# Patient Record
Sex: Female | Born: 1975 | Race: White | Hispanic: Yes | Marital: Married | Smoking: Never smoker
Health system: Southern US, Community
[De-identification: ages and names within clinical notes are randomized; demographics above are authoritative.]

## PROBLEM LIST (undated history)

## (undated) DIAGNOSIS — R51 Headache: Secondary | ICD-10-CM

## (undated) DIAGNOSIS — R112 Nausea with vomiting, unspecified: Secondary | ICD-10-CM

## (undated) DIAGNOSIS — Z9889 Other specified postprocedural states: Secondary | ICD-10-CM

## (undated) DIAGNOSIS — D649 Anemia, unspecified: Secondary | ICD-10-CM

## (undated) DIAGNOSIS — O021 Missed abortion: Secondary | ICD-10-CM

## (undated) HISTORY — PX: OTHER SURGICAL HISTORY: SHX169

## (undated) HISTORY — PX: CHOLECYSTECTOMY: SHX55

## (undated) HISTORY — DX: Missed abortion: O02.1

---

## 2006-02-22 ENCOUNTER — Inpatient Hospital Stay (HOSPITAL_COMMUNITY): Admission: AD | Admit: 2006-02-22 | Discharge: 2006-02-22 | Payer: Self-pay | Admitting: *Deleted

## 2006-02-24 ENCOUNTER — Inpatient Hospital Stay (HOSPITAL_COMMUNITY): Admission: AD | Admit: 2006-02-24 | Discharge: 2006-02-24 | Payer: Self-pay | Admitting: Obstetrics

## 2006-02-27 ENCOUNTER — Inpatient Hospital Stay (HOSPITAL_COMMUNITY): Admission: AD | Admit: 2006-02-27 | Discharge: 2006-02-27 | Payer: Self-pay | Admitting: Obstetrics

## 2012-10-21 ENCOUNTER — Encounter: Payer: Self-pay | Admitting: Gynecology

## 2012-10-21 ENCOUNTER — Ambulatory Visit (INDEPENDENT_AMBULATORY_CARE_PROVIDER_SITE_OTHER): Payer: Self-pay | Admitting: Gynecology

## 2012-10-21 VITALS — BP 112/70 | Ht 60.5 in | Wt 153.0 lb

## 2012-10-21 DIAGNOSIS — N938 Other specified abnormal uterine and vaginal bleeding: Secondary | ICD-10-CM

## 2012-10-21 DIAGNOSIS — H539 Unspecified visual disturbance: Secondary | ICD-10-CM

## 2012-10-21 DIAGNOSIS — N949 Unspecified condition associated with female genital organs and menstrual cycle: Secondary | ICD-10-CM

## 2012-10-21 DIAGNOSIS — R51 Headache: Secondary | ICD-10-CM

## 2012-10-21 DIAGNOSIS — R635 Abnormal weight gain: Secondary | ICD-10-CM

## 2012-10-21 MED ORDER — NORETHIN ACE-ETH ESTRAD-FE 1-20 MG-MCG PO TABS: 1.0000 | ORAL_TABLET | Freq: Every day | ORAL | Status: DC

## 2012-10-21 NOTE — Patient Instructions (Addendum)
Uso de los anticonceptivos orales  (Oral Contraception Use) Los anticonceptivos orales son medicamentos que se utilizan para evitar el embarazo. Su funcin es evitar que los ovarios liberen vulos. Las hormonas de los anticonceptivos orales hacen que el moco cervical se haga ms espeso, lo que evita que el esperma ingrese al tero. Tambin hacen que la membrana que tapiza el tero se vuelva ms fina, lo que no permite que el huevo fertilizado se adhiera a la pared del tero. Los anticonceptivos orales son muy efectivos cuando se toman exactamente como se prescriben. Sin embargo, los anticonceptivos orales no previenen contra las enfermedades de transmisin sexual (ETS). La prctica del sexo seguro, como el uso de preservativos, junto con los anticonceptivos orales, ayudan a prevenir ese tipo de enfermedades. Antes de tomar la pldora, usted debe hacerse un examen fsico y un [place]. El mdico podr indicarle anlisis de sangre, si es necesario. El mdico se asegurar de que usted es una buena candidata para usar anticonceptivos orales. Converse con su mdico acerca de los posibles efectos secundarios de los anticonceptivos orales. Cuando se inicia el uso de anticonceptivos orales, se pueden tomar durante 2 a 3 meses para que el cuerpo se adapte a los cambios en los niveles hormonales en el cuerpo.  CMO TOMAR LOS ANTICONCEPTIVOS ORALES  El mdico le indicar como comenzar a tomar el primer ciclo de anticonceptivos orales. De lo contrario usted puede:   Comenzar el 1er. da del ciclo menstrual. No necesitar proteccin anticonceptiva adicional al comenzar en este momento.  Comenzar el primer domingo luego de su perodo menstrual, o el da en que adquiere el medicamento. En estos casos deber tener proteccin anticonceptiva adicional durante los primeros 7 das del ciclo. Luego de comenzar a tomar los anticonceptivos orales:   Si olvid de tomar 1 pldora, tmela tan pronto como lo recuerde. Tome la  siguiente pldora a la hora habitual.  Si olvida tomar 2  ms pldoras, utilice un mtodo anticonceptivo adicional hasta que comience su prximo perodo menstrual.  Si utiliza el envase de 28 pldoras y olvida tomar 1 de las ltimas 7 (pldoras sin hormonas), sto no tiene importancia. Simplemente deseche el resto de las pldoras que no contienen hormonas y comience un nuevo envase. No importa cuando comience a tomar los anticonceptivos, siempre empiece un nuevo envase el mismo da de la semana. Tenga un envase extra de pldoras anticonceptivas y use un mtodo anticonceptivo adicional para el caso en que se olvide de tomar algunas pldoras o pierda la caja.  INSTRUCCIONES PARA EL CUIDADO DOMICILIARIO  No fume.  Siempre use un condn para protegerse de las enfermedades de transmisin sexual. Los anticonceptivos orales no protegen contra las enfermedades de transmisin sexual.  Marque en un calendario las fechas en las que tiene sus perodos menstruales.  Lea la informacin y consejos que vienen con las pldoras. Pngase en contacto con el mdico siempre que tenga preguntas. SOLICITE ATENCIN MDICA SI:  Presenta nuseas o vmitos.  Tiene flujo o sangrado vaginal anormal.  Aparece una erupcin cutnea.  No tiene el perodo menstrual.  Pierde el cabello.  Necesita tratamiento por cambios en su estado de nimo o por depresin.  Se siente mareada al tomar la pldora.  Comienza a aparecer acn con el uso de los anticonceptivos orales.  Queda embarazada. SOLICITE ATENCIN MDICA DE INMEDIATO SI:  Siente dolor en el pecho.  Le falta el aire.  Le duele mucho la cabeza y no puede controlar el dolor.  Siente adormecimiento o tiene   dificultad para hablar.  Tiene problemas de visin.  Presenta dolor, inflamacin o hinchazn en las piernas. Document Released: 11/08/2011 Document Revised: 02/11/2012 ExitCare Patient Information 2013 [employer].   

## 2012-10-21 NOTE — Progress Notes (Signed)
Patient is a 36 year old gravida 4 para 2 (spontaneous AB) who is new to the practice essentially. She has not been seen here since 2007. She states she's not using any form of contraception. The reason for visit is that she's had 2 menstrual cycles within 30 days. She had a home pregnancy test was negative. Urine pregnancy test was repeated here in the office which was negative as well. She stated she's had some fluctuations in her weight. She was recently involved in some job-related injury that affected her back and her neck that sometimes she gets headaches and occasional double vision.. She denies any gait or balance disturbance. She did deny any nipple discharge. She stated that she went to an urgent care with the symptoms several weeks ago and they put her on Provera with the following regimen: Provera 2.5 mg one every 6 hours for 4 days followed by one by mouth every 8 hour for 4 days followed by one by mouth every 12 hours for 4 days then 1 by mouth daily to complete 28 days. She stated her bleeding stopped yesterday just a slight brownish discharge. Patient stated several years ago in [place] she did use oral contraceptive pills. She denies any family history of any bleeding or clotting disorders. Patient is interested started back on oral contraceptive pill. She stated that she had a complete gynecological examination last year and that she's had normal Pap smears.  Exam: Bartholin urethra Skene was within normal limits Vagina: Brown dried blood was noted Cervix: No lesions or discharge Uterus: Anteverted normal size shape and consistency Adnexa: No palpable masses or tenderness Rectal exam: Not done  Assessment/plan: Dysfunctional uterine bleeding. Patient symptoms await fluctuation and headaches we'll go ahead and check not only a TSH will also check a prolactin level as well. She will be instructed to stop the Provera. She'll be started on Junel 1/20 oral contraceptive pill. She'll return back  to the office to see me in January for complete annual gynecological examination. Will notify her if there is any abnormality of any the above mentioned test. If she continues to have dysfunctional uterine bleeding after several months on the oral contraceptive pill I have explained her that she would need complete evaluation to include a sono infusion hysterogram with possible endometrial biopsy. All the above instructions were provided in Spanish and we'll follow accordingly. She will continue with her physical therapy as a result of her job-related injury.

## 2012-10-22 LAB — PROLACTIN: Prolactin: 8.9 ng/mL

## 2012-10-22 LAB — TSH: TSH: 0.987 u[IU]/mL (ref 0.350–4.500)

## 2012-12-18 ENCOUNTER — Encounter: Payer: Self-pay | Admitting: Gynecology

## 2012-12-18 ENCOUNTER — Ambulatory Visit (INDEPENDENT_AMBULATORY_CARE_PROVIDER_SITE_OTHER): Payer: BC Managed Care – HMO | Admitting: Gynecology

## 2012-12-18 VITALS — BP 120/80 | Ht 60.25 in | Wt 155.0 lb

## 2012-12-18 DIAGNOSIS — R6882 Decreased libido: Secondary | ICD-10-CM | POA: Insufficient documentation

## 2012-12-18 DIAGNOSIS — Z01419 Encounter for gynecological examination (general) (routine) without abnormal findings: Secondary | ICD-10-CM

## 2012-12-18 DIAGNOSIS — R635 Abnormal weight gain: Secondary | ICD-10-CM | POA: Insufficient documentation

## 2012-12-18 LAB — CBC WITH DIFFERENTIAL/PLATELET
Basophils Absolute: 0.1 10*3/uL (ref 0.0–0.1)
Basophils Relative: 1 % (ref 0–1)
Eosinophils Absolute: 0.3 10*3/uL (ref 0.0–0.7)
HCT: 36.6 % (ref 36.0–46.0)
Hemoglobin: 12.4 g/dL (ref 12.0–15.0)
Lymphs Abs: 3 10*3/uL (ref 0.7–4.0)
MCHC: 33.9 g/dL (ref 30.0–36.0)
MCV: 85.1 fL (ref 78.0–100.0)
Monocytes Absolute: 0.7 10*3/uL (ref 0.1–1.0)
RBC: 4.3 MIL/uL (ref 3.87–5.11)
WBC: 8.9 10*3/uL (ref 4.0–10.5)

## 2012-12-18 LAB — HEMOGLOBIN A1C: Hgb A1c MFr Bld: 5.7 % — ABNORMAL HIGH (ref <5.7)

## 2012-12-18 NOTE — Patient Instructions (Addendum)
Intrauterine Device Information  An intrauterine device (IUD) is inserted into your uterus and prevents pregnancy. There are 2 types of IUDs available:  · Copper IUD. This type of IUD is wrapped in copper wire and is placed inside the uterus. Copper makes the uterus and fallopian tubes produce a fluid that kills sperm. The copper IUD can stay in place for 10 years.  · Hormone IUD. This type of IUD contains the hormone progestin (synthetic progesterone). The hormone thickens the cervical mucus and prevents sperm from entering the uterus, and it also thins the uterine lining to prevent implantation of a fertilized egg. The hormone can weaken or kill the sperm that get into the uterus. The hormone IUD can stay in place for 5 years.  Your caregiver will make sure you are a good candidate for a contraceptive IUD. Discuss with your caregiver the possible side effects.  ADVANTAGES  · It is highly effective, reversible, long-acting, and low maintenance.  · There are no estrogen-related side effects.  · An IUD can be used when breastfeeding.  · It is not associated with weight gain.  · It works immediately after insertion.  · The copper IUD does not interfere with your female hormones.  · The progesterone IUD can make heavy menstrual periods lighter.  · The progesterone IUD can be used for 5 years.  · The copper IUD can be used for 10 years.  DISADVANTAGES  · The progesterone IUD can be associated with irregular bleeding patterns.  · The copper IUD can make your menstrual flow heavier and more painful.  · You may experience cramping and vaginal bleeding after insertion.  Document Released: 10/23/2004 Document Revised: 02/11/2012 Document Reviewed: 03/24/2011  ExitCare® Patient Information ©2013 [employer] LLC.

## 2012-12-18 NOTE — Progress Notes (Signed)
Madison Stewart 08/05/1976 [id number]   History:    37 y.o.  for annual gyn exam with no major complaints. She states at times she has decreased libido. She is currently on Junel 1/20 oral contraceptive pill. Patient's last Pap smear was in 2012 reportedly normal. Patient does her monthly self breast examination. Patient with strong family history of diabetes in her father.  Past medical history,surgical history, family history and social history were all reviewed and documented in the EPIC chart.  Gynecologic History Patient's last menstrual period was 12/12/2012. Contraception: OCP (estrogen/progesterone) Last Pap: 2012. Results were: normal Last mammogram: Not indicated. Results were: Not indicated  Obstetric History OB History    Grav Para Term Preterm Abortions TAB SAB Ect Mult Living   4 2 2  2  2   2      # Outc Date GA Lbr Len/2nd Wgt Sex Del Anes PTL Lv   1 TRM     M SVD  No Yes   2 SAB            3 SAB            4 TRM     F SVD  No Yes       ROS: A ROS was performed and pertinent positives and negatives are included in the history.  GENERAL: No fevers or chills. HEENT: No change in vision, no earache, sore throat or sinus congestion. NECK: No pain or stiffness. CARDIOVASCULAR: No chest pain or pressure. No palpitations. PULMONARY: No shortness of breath, cough or wheeze. GASTROINTESTINAL: No abdominal pain, nausea, vomiting or diarrhea, melena or bright red blood per rectum. GENITOURINARY: No urinary frequency, urgency, hesitancy or dysuria. MUSCULOSKELETAL: No joint or muscle pain, no back pain, no recent trauma. DERMATOLOGIC: No rash, no itching, no lesions. ENDOCRINE: No polyuria, polydipsia, no heat or cold intolerance. No recent change in weight. HEMATOLOGICAL: No anemia or easy bruising or bleeding. NEUROLOGIC: No headache, seizures, numbness, tingling or weakness. PSYCHIATRIC: No depression, no loss of interest in normal activity or change in sleep pattern.      Exam: chaperone present  BP 120/80  Ht 5' 0.25" (1.53 m)  Wt 155 lb (70.308 kg)  BMI 30.02 kg/m2  LMP 12/12/2012  Body mass index is 30.02 kg/(m^2).  General appearance : Well developed well nourished female. No acute distress HEENT: Neck supple, trachea midline, no carotid bruits, no thyroidmegaly Lungs: Clear to auscultation, no rhonchi or wheezes, or rib retractions  Heart: Regular rate and rhythm, no murmurs or gallops Breast:Examined in sitting and supine position were symmetrical in appearance, no palpable masses or tenderness,  no skin retraction, no nipple inversion, no nipple discharge, no skin discoloration, no axillary or supraclavicular lymphadenopathy Abdomen: no palpable masses or tenderness, no rebound or guarding Extremities: no edema or skin discoloration or tenderness  Pelvic:  Bartholin, Urethra, Skene Glands: Within normal limits             Vagina: No gross lesions or discharge  Cervix: No gross lesions or discharge  Uterus  anteverted, normal size, shape and consistency, non-tender and mobile  Adnexa  Without masses or tenderness  Anus and perineum  normal   Rectovaginal  normal sphincter tone without palpated masses or tenderness             Hemoccult not indicated     Assessment/Plan:  37 y.o. female for annual exam who had complained of decreased libido. We will check her TSH as well as total testosterone  level CBC cholesterol and urinalysis. No Pap smear done today the new guidelines were discussed. Patient was given literature information on the ParaGard T380A nonhormonal IUD. Patient will finish her current pack of the oral contraceptive pill and contact the office when her menses start so we can place the ParaGard T380A IUD. Literature information was provided in [place]. She was instructed to do her monthly self breast examination. The following labs were: Hemoglobin A1c, TSH, total cholesterol and urinalysis.    Ok Edwards MD, 10:42 PM  12/18/2012

## 2012-12-19 LAB — TESTOSTERONE: Testosterone: 32.74 ng/dL (ref 10–70)

## 2012-12-19 LAB — URINALYSIS W MICROSCOPIC + REFLEX CULTURE
Bacteria, UA: NONE SEEN
Crystals: NONE SEEN
Glucose, UA: NEGATIVE mg/dL
Protein, ur: NEGATIVE mg/dL
Urobilinogen, UA: 0.2 mg/dL (ref 0.0–1.0)

## 2012-12-29 ENCOUNTER — Telehealth: Payer: Self-pay | Admitting: Gynecology

## 2012-12-29 NOTE — Telephone Encounter (Signed)
Madison Stewart called this pt today with her [employer] benefits as she speaks Spanish only. Her [facility] insurance covers it at 100% under preventative services,no copay. She will call at start of next cycle to schedule an appt for insertion/WL

## 2013-01-03 HISTORY — PX: OTHER SURGICAL HISTORY: SHX169

## 2013-01-13 ENCOUNTER — Encounter: Payer: Self-pay | Admitting: Gynecology

## 2013-01-13 ENCOUNTER — Ambulatory Visit (INDEPENDENT_AMBULATORY_CARE_PROVIDER_SITE_OTHER): Payer: BC Managed Care – PPO | Admitting: Gynecology

## 2013-01-13 VITALS — BP 124/82

## 2013-01-13 DIAGNOSIS — Z975 Presence of (intrauterine) contraceptive device: Secondary | ICD-10-CM | POA: Insufficient documentation

## 2013-01-13 DIAGNOSIS — Z30430 Encounter for insertion of intrauterine contraceptive device: Secondary | ICD-10-CM

## 2013-01-13 NOTE — Patient Instructions (Addendum)
Informacin sobre el dispositivo intrauterino  (Intrauterine Device Information) El dispositivo intrauterino (DIU) se inserta en el tero e impide el embarazo. Hay dos tipos de DIU:   DIU de cobre. Este tipo de DIU est recubierto con un alambre de cobre y se inserta dentro del tero. El cobre hace que el tero y las trompas de [place] produzcan un liquido que [employer] espermatozoides. El DIU de cobre puede Geneticist, molecular durante 10 aos.  DIU hormonal. Este tipo de DIU contiene la hormona progestina (progesterona sinttica). La hormona espesa el moco cervical y evita que los espermatozoides ingresen al tero y tambin afina la membrana que cubre el tero para evitar la implantacin del vulo fertilizado. La hormona debilita o destruye los espermatozoides que ingresan al tero. El DIU hormonal puede Geneticist, molecular durante 5 aos. El mdico se asegurar de que usted es una buena candidata para usar el DIU cono anticonceptivo. Hable con su mdico acerca de los posibles efectos secundarios.  VENTAJAS  Es muy eficaz, reversible, de accin prolongada y de bajo mantenimiento.  No hay efectos secundarios relacionados con el estrgeno.  El DIU puede ser utilizado durante la Market researcher.  No est asociado con el aumento de [place].  Funciona inmediatamente despus de la insercin.  El DIU de cobre no interfiere con las hormonas femeninas.  El DIU con progesterona puede hacer que los perodos menstruales no sean tan abundantes.  El DIU de progesterona puede usarse durante 5 aos.  El DIU de cobre puede usarse durante 10 aos. DESVENTAJAS  El DIU de progesterona puede estar asociado con patrones de sangrado irregular.  El DIU de cobre puede hacer que el flujo menstrual ms abundante y doloroso.  Puede experimentar clicos y sangrado vaginal despus de la insercin. Document Released: 05/09/2010 Document Revised: 02/11/2012 [facility] Patient Information 2013 [place], [place].

## 2013-01-13 NOTE — Progress Notes (Signed)
Patient is a 37 year old who was seen in the office on January 16 for annual exam. She had been on the oral contraceptive pill and wanted to change to a nonhormonal form of contraception. She believes that this is also contributed to her decreased libido. Literature information had previously been provided on the ParaGard T380A IUD. The risks benefits and pros and cons had been discussed. She is fully aware that this form of contraception is 99% effective and is good for 10 years. Patient's currently menstruating.  Exam: Bartholin urethra Skene was within normal limits Vagina: Menstrual blood is present Cervix: Menstrual blood present no lesions seen Uterus retroverted normal size shape and consistency no palpable masses or tenderness  Adnexa: No palpable masses or tenderness Rectal exam not done  Procedure note: The cervix was cleansed with Betadine solution. A single-tooth tenaculum was placed on the anterior cervical lip. The uterus sounded to 8 cm. The ParaGard T380A IUD was shown to the patient and inserted in a sterile fashion and the string was trimmed. The single-tooth tenaculum was removed.  Assessment/plan: Patient status post placement of ParaGard T380A IUD without any complications. Patient will return back in one month for followup.

## 2013-02-10 ENCOUNTER — Ambulatory Visit (INDEPENDENT_AMBULATORY_CARE_PROVIDER_SITE_OTHER): Payer: BC Managed Care – PPO | Admitting: Gynecology

## 2013-02-10 ENCOUNTER — Encounter: Payer: Self-pay | Admitting: Gynecology

## 2013-02-10 VITALS — BP 102/60

## 2013-02-10 DIAGNOSIS — Z30431 Encounter for routine checking of intrauterine contraceptive device: Secondary | ICD-10-CM

## 2013-02-10 NOTE — Progress Notes (Signed)
Patient presented to the office today for her one month for followup after having placed a Mirena IUD. Patient is doing well. Very minimal spotting reported. She would like to have the IUD string trimmed because of her partner.  Exam: Bartholin urethra Skene was within normal limits Vagina: No lesions or discharge Cervix: IUD string seen in the string was trimmed Uterus: Anteverted normal size shape and consistency Adnexa: No palpable masses or tenderness Rectal exam: Not done  Assessment/plan: Patient one month status post placement of Mirena IUD doing well. Patient scheduled to return to the office next year for her annual exam or when necessary.

## 2013-07-03 NOTE — H&P (Signed)
History of Present Illness The patient is a 37 year old female who presents with neck pain. The patient is seen today in referral from Dr. Ethelene Hal (15 days post C6 SNRB on 06/09/13). The patient reports symptoms involving the right posterior neck and right lateral neck which began 9 month(s) (1/2 ) ago. The symptoms began following a specific injury (pulling an object from a machine with a pulling sensation about the right scapula region and radiating pain into the right posterior cervical region ). Symptoms include neck pain (posterior cervical radiating into the right scapula and right upper ext. to the level of the hand ), neck stiffness, numbness (right upper ext. to the level of the elbow ), weakness (right upper ext. ) and headaches. The patient describes the pain as sharp (and feel like cramps, new symptom of right index finger pain in addition to the thumb).The patient describes their symptoms as moderate in severity.The patient does feel that the symptoms are unchanged. Current treatment includes acetaminophen and muscle relaxants (Skelaxin but has ran out). Past evaluation has included cervical spine MRI and physical therapy evaluation. Past treatment has included physical therapy (with some relief of pain and stiffness ) and spinal injections ([id number] cervical ESI with minimal relief ). 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    Subjective Transcription  She returns today for a followup. She had an excellent response to the recent C-6 selective nerve root block that Dr. Ethelene Hal performed on 06-03-13. The patient states she felt excellent for several days and then it started to return. She states that overall her quality of life is severely diminished. The pain is significant. She would like this fixed.    Allergies No Known Allergies. 10/03/2012   Social History Marital status. married Living situation. live alone Illicit drug use. no Tobacco use. never  smoker Pain Contract. no Number of flights of stairs before winded. 2-3 Current work status. working full time Children. 2 Alcohol use. never consumed alcohol Exercise. Exercises weekly; does other Drug/Alcohol Rehab (Previously). no Drug/Alcohol Rehab (Currently). no   Medication History Acetaminophen (500MG  Tablet, 2 Oral daily) Active. Medications Reconciled.   Objective Transcription  The clinical exam is essentially unchanged. She still has numbness in the C6 distribution on the right. There is weakness of the biceps wrist extensor on the right, about a 4 out of 5. Negative Babinski. Negative clonus. Negative Hoffman's sign. No shortness of breath or chest pain. No other sensory deficits other than those on the right C6.    Assessment & Plan Cervical disc degeneration (722.4) Current Plans l Risks of surgery include, but are not limited to: Throat pain,swallowing difficulty, hoarseness or change in voice, Death, stroke, paralysis, nerve root damage/injury, bleeding, blood clots, loss of bowel/bladder control, hardware failure, or malposition, spinal fluid leak, adjacent segment disease, non-union, need for further surgery, ongoing or worse pain, infection and recurrent disc herniation  l Pt Education - How to access health information online: discussed with patient and provided information. l Restarted Skelaxin 800MG , 1 (one) Tablet every eight hours, as needed. May cause drowsiness., #90, 30 days starting 06/24/2013, No Refill.  Cervical radiculopathy (723.4)   Plans Transcription  At this point in time, we have gone over the risks of an ACDF.to include infection, bleeding, nerve damage, death , stroke, paralysis, failure to fuse, ongoing or worse pain, and need for further surgery.    All questions were encouraged and answered with the patient and her son. She has expressed  a desire to proceed with surgery. I will go ahead and get  appropriate clearance. I have allowed her to continue to work on restrictions. I just decreased her to four hours a day due to 8 hours causing exacerbation of her symptoms.

## 2013-07-08 ENCOUNTER — Encounter (HOSPITAL_COMMUNITY)
Admission: RE | Admit: 2013-07-08 | Discharge: 2013-07-08 | Disposition: A | Discharge status: Home / Self Care | Payer: Worker's Compensation | Source: Ambulatory Visit | Attending: Orthopedic Surgery | Admitting: Orthopedic Surgery

## 2013-07-08 ENCOUNTER — Encounter (HOSPITAL_COMMUNITY): Payer: Self-pay

## 2013-07-08 ENCOUNTER — Ambulatory Visit (HOSPITAL_COMMUNITY)
Admission: RE | Admit: 2013-07-08 | Discharge: 2013-07-08 | Disposition: A | Discharge status: Home / Self Care | Payer: BC Managed Care – PPO | Source: Ambulatory Visit | Attending: Orthopedic Surgery | Admitting: Orthopedic Surgery

## 2013-07-08 DIAGNOSIS — Z01818 Encounter for other preprocedural examination: Secondary | ICD-10-CM | POA: Insufficient documentation

## 2013-07-08 DIAGNOSIS — M5030 Other cervical disc degeneration, unspecified cervical region: Secondary | ICD-10-CM | POA: Insufficient documentation

## 2013-07-08 HISTORY — DX: Nausea with vomiting, unspecified: Z98.890

## 2013-07-08 HISTORY — DX: Nausea with vomiting, unspecified: R11.2

## 2013-07-08 HISTORY — DX: Headache: R51

## 2013-07-08 LAB — HCG, SERUM, QUALITATIVE: Preg, Serum: NEGATIVE

## 2013-07-08 LAB — CBC
Hemoglobin: 11.9 g/dL — ABNORMAL LOW (ref 12.0–15.0)
MCHC: 33.2 g/dL (ref 30.0–36.0)
RBC: 4.52 MIL/uL (ref 3.87–5.11)
RDW: 14.4 % (ref 11.5–15.5)

## 2013-07-08 LAB — SURGICAL PCR SCREEN: Staphylococcus aureus: NEGATIVE

## 2013-07-08 MED ORDER — DEXAMETHASONE SODIUM PHOSPHATE 4 MG/ML IJ SOLN
4.0000 mg | Freq: Once | INTRAMUSCULAR | Status: AC
  Administered 2013-07-09: 4 mg via INTRAVENOUS
  Filled 2013-07-08: qty 1

## 2013-07-08 MED ORDER — CEFAZOLIN SODIUM-DEXTROSE 2-3 GM-% IV SOLR
2.0000 g | INTRAVENOUS | Status: AC
  Administered 2013-07-09: 2 g via INTRAVENOUS
  Filled 2013-07-08: qty 50

## 2013-07-08 MED ORDER — ACETAMINOPHEN 10 MG/ML IV SOLN
1000.0000 mg | Freq: Four times a day (QID) | INTRAVENOUS | Status: DC
  Filled 2013-07-08: qty 100

## 2013-07-08 NOTE — Progress Notes (Signed)
Pt denies SOB, chest pain, and being under the care of a cardiologist. Pt denies having an echo, stress test, and cardiac catheterization.Pt made aware to bring brace DOS.

## 2013-07-08 NOTE — Pre-Procedure Instructions (Signed)
Madison Stewart  07/08/2013   Your procedure is scheduled on:  Thursday, July 09, 2013  Report to [facility] at 9:30 AM.  Call this number if you have problems the morning of surgery: [phone]   Remember:   Do not eat food or drink liquids after midnight.   Take these medicines the morning of surgery with A SIP OF WATER: none Stop taking Aspirin and herbal medications. Do not take any NSAIDs ie: Ibuprofen, Advil, Naproxen or any medication containing Aspirin.  Do not wear jewelry, make-up or nail polish.  Do not wear lotions, powders, or perfumes. You may wear deodorant.  Do not shave 48 hours prior to surgery  Do not bring valuables to the hospital.  [facility] is not responsible for any belongings or valuables.  Contacts, dentures or bridgework may not be worn into surgery.  Leave suitcase in the car. After surgery it may be brought to your room.  For patients admitted to the hospital, checkout time is 11:00 AM the day of discharge.   Patients discharged the day of surgery will not be allowed to drive home.  Name and phone number of your driver:   Special Instructions: Shower using CHG 2 nights before surgery and the night before surgery.  If you shower the day of surgery use CHG.  Use special wash - you have one bottle of CHG for all showers.  You should use approximately 1/3 of the bottle for each shower.   Please read over the following fact sheets that you were given: Pain Booklet, Coughing and Deep Breathing, MRSA Information and Surgical Site Infection Prevention

## 2013-07-09 ENCOUNTER — Observation Stay (HOSPITAL_COMMUNITY)
Admission: RE | Admit: 2013-07-09 | Discharge: 2013-07-10 | Disposition: A | Discharge status: Home / Self Care | Payer: Worker's Compensation | Source: Ambulatory Visit | Attending: Orthopedic Surgery | Admitting: Orthopedic Surgery

## 2013-07-09 ENCOUNTER — Encounter (HOSPITAL_COMMUNITY)
Admission: RE | Disposition: A | Discharge status: Home / Self Care | Payer: Self-pay | Source: Ambulatory Visit | Attending: Orthopedic Surgery

## 2013-07-09 ENCOUNTER — Ambulatory Visit (HOSPITAL_COMMUNITY): Payer: Worker's Compensation

## 2013-07-09 ENCOUNTER — Encounter (HOSPITAL_COMMUNITY): Payer: Self-pay | Admitting: *Deleted

## 2013-07-09 ENCOUNTER — Encounter (HOSPITAL_COMMUNITY): Payer: Self-pay | Admitting: Certified Registered Nurse Anesthetist

## 2013-07-09 ENCOUNTER — Ambulatory Visit (HOSPITAL_COMMUNITY): Payer: Worker's Compensation | Admitting: Certified Registered Nurse Anesthetist

## 2013-07-09 DIAGNOSIS — Z79899 Other long term (current) drug therapy: Secondary | ICD-10-CM | POA: Insufficient documentation

## 2013-07-09 DIAGNOSIS — M47812 Spondylosis without myelopathy or radiculopathy, cervical region: Principal | ICD-10-CM | POA: Insufficient documentation

## 2013-07-09 DIAGNOSIS — Z01812 Encounter for preprocedural laboratory examination: Secondary | ICD-10-CM | POA: Insufficient documentation

## 2013-07-09 HISTORY — PX: ANTERIOR CERVICAL DECOMP/DISCECTOMY FUSION: SHX1161

## 2013-07-09 HISTORY — PX: ANTERIOR FUSION CERVICAL SPINE: SUR626

## 2013-07-09 SURGERY — ANTERIOR CERVICAL DECOMPRESSION/DISCECTOMY FUSION 1 LEVEL
Anesthesia: General | Wound class: Clean

## 2013-07-09 MED ORDER — SODIUM CHLORIDE 0.9 % IJ SOLN: 3.0000 mL | INTRAMUSCULAR | Status: DC | PRN

## 2013-07-09 MED ORDER — MIDAZOLAM HCL 5 MG/5ML IJ SOLN
INTRAMUSCULAR | Status: DC | PRN
  Administered 2013-07-09: 2 mg via INTRAVENOUS

## 2013-07-09 MED ORDER — CEFAZOLIN SODIUM 1-5 GM-% IV SOLN
1.0000 g | Freq: Three times a day (TID) | INTRAVENOUS | Status: AC
  Administered 2013-07-09 (×2): 1 g via INTRAVENOUS
  Filled 2013-07-09 (×2): qty 50

## 2013-07-09 MED ORDER — THROMBIN 20000 UNITS EX SOLR
CUTANEOUS | Status: AC
  Filled 2013-07-09: qty 20000

## 2013-07-09 MED ORDER — DEXAMETHASONE 4 MG PO TABS
4.0000 mg | ORAL_TABLET | Freq: Four times a day (QID) | ORAL | Status: DC
  Filled 2013-07-09 (×7): qty 1

## 2013-07-09 MED ORDER — BUPIVACAINE-EPINEPHRINE PF 0.25-1:200000 % IJ SOLN
INTRAMUSCULAR | Status: AC
  Filled 2013-07-09: qty 30

## 2013-07-09 MED ORDER — LACTATED RINGERS IV SOLN
INTRAVENOUS | Status: DC | PRN
  Administered 2013-07-09 (×2): via INTRAVENOUS

## 2013-07-09 MED ORDER — METHOCARBAMOL 500 MG PO TABS: 500.0000 mg | ORAL_TABLET | Freq: Three times a day (TID) | ORAL | Status: DC | PRN

## 2013-07-09 MED ORDER — NEOSTIGMINE METHYLSULFATE 1 MG/ML IJ SOLN
INTRAMUSCULAR | Status: DC | PRN
  Administered 2013-07-09: 3 mg via INTRAVENOUS

## 2013-07-09 MED ORDER — PHENOL 1.4 % MT LIQD: 1.0000 | OROMUCOSAL | Status: DC | PRN

## 2013-07-09 MED ORDER — ROCURONIUM BROMIDE 100 MG/10ML IV SOLN
INTRAVENOUS | Status: DC | PRN
  Administered 2013-07-09: 50 mg via INTRAVENOUS

## 2013-07-09 MED ORDER — HYDROMORPHONE HCL PF 1 MG/ML IJ SOLN
0.2500 mg | INTRAMUSCULAR | Status: DC | PRN
  Administered 2013-07-09 (×2): 0.5 mg via INTRAVENOUS

## 2013-07-09 MED ORDER — GLYCOPYRROLATE 0.2 MG/ML IJ SOLN
INTRAMUSCULAR | Status: DC | PRN
  Administered 2013-07-09: 0.4 mg via INTRAVENOUS

## 2013-07-09 MED ORDER — BUPIVACAINE-EPINEPHRINE 0.25% -1:200000 IJ SOLN
INTRAMUSCULAR | Status: DC | PRN
  Administered 2013-07-09: 4 mL

## 2013-07-09 MED ORDER — HYDROMORPHONE HCL PF 1 MG/ML IJ SOLN
INTRAMUSCULAR | Status: DC | PRN
  Administered 2013-07-09: 1 mg via INTRAVENOUS

## 2013-07-09 MED ORDER — OXYCODONE-ACETAMINOPHEN 10-325 MG PO TABS: 1.0000 | ORAL_TABLET | ORAL | Status: DC | PRN

## 2013-07-09 MED ORDER — ACETAMINOPHEN 10 MG/ML IV SOLN
1000.0000 mg | Freq: Four times a day (QID) | INTRAVENOUS | Status: DC
  Administered 2013-07-09 – 2013-07-10 (×3): 1000 mg via INTRAVENOUS
  Filled 2013-07-09 (×4): qty 100

## 2013-07-09 MED ORDER — OXYCODONE HCL 5 MG PO TABS
10.0000 mg | ORAL_TABLET | ORAL | Status: DC | PRN
  Administered 2013-07-10 (×2): 10 mg via ORAL
  Filled 2013-07-09 (×2): qty 2

## 2013-07-09 MED ORDER — HYDROMORPHONE HCL PF 1 MG/ML IJ SOLN
INTRAMUSCULAR | Status: AC
  Filled 2013-07-09: qty 1

## 2013-07-09 MED ORDER — THROMBIN 20000 UNITS EX SOLR
CUTANEOUS | Status: DC | PRN
  Administered 2013-07-09: 13:00:00 via TOPICAL

## 2013-07-09 MED ORDER — LIDOCAINE HCL 4 % MT SOLN
OROMUCOSAL | Status: DC | PRN
  Administered 2013-07-09: 4 mL via TOPICAL

## 2013-07-09 MED ORDER — ARTIFICIAL TEARS OP OINT
TOPICAL_OINTMENT | OPHTHALMIC | Status: DC | PRN
  Administered 2013-07-09: 1 via OPHTHALMIC

## 2013-07-09 MED ORDER — FENTANYL CITRATE 0.05 MG/ML IJ SOLN
INTRAMUSCULAR | Status: DC | PRN
  Administered 2013-07-09 (×2): 100 ug via INTRAVENOUS
  Administered 2013-07-09: 50 ug via INTRAVENOUS

## 2013-07-09 MED ORDER — METHOCARBAMOL 100 MG/ML IJ SOLN: 500.0000 mg | Freq: Four times a day (QID) | INTRAVENOUS | Status: DC | PRN

## 2013-07-09 MED ORDER — SODIUM CHLORIDE 0.9 % IV SOLN
250.0000 mL | INTRAVENOUS | Status: DC
  Administered 2013-07-09: 250 mL via INTRAVENOUS

## 2013-07-09 MED ORDER — ONDANSETRON HCL 4 MG/2ML IJ SOLN
4.0000 mg | INTRAMUSCULAR | Status: DC | PRN
  Administered 2013-07-09: 4 mg via INTRAVENOUS
  Filled 2013-07-09: qty 2

## 2013-07-09 MED ORDER — ZOLPIDEM TARTRATE 5 MG PO TABS: 5.0000 mg | ORAL_TABLET | Freq: Every evening | ORAL | Status: DC | PRN

## 2013-07-09 MED ORDER — MORPHINE SULFATE 2 MG/ML IJ SOLN
1.0000 mg | INTRAMUSCULAR | Status: DC | PRN
  Administered 2013-07-09: 2 mg via INTRAVENOUS
  Filled 2013-07-09: qty 1

## 2013-07-09 MED ORDER — DEXAMETHASONE SODIUM PHOSPHATE 4 MG/ML IJ SOLN
4.0000 mg | Freq: Four times a day (QID) | INTRAMUSCULAR | Status: DC
  Administered 2013-07-09 – 2013-07-10 (×3): 4 mg via INTRAVENOUS
  Filled 2013-07-09 (×7): qty 1

## 2013-07-09 MED ORDER — LIDOCAINE HCL (CARDIAC) 20 MG/ML IV SOLN
INTRAVENOUS | Status: DC | PRN
  Administered 2013-07-09: 60 mg via INTRAVENOUS

## 2013-07-09 MED ORDER — ACETAMINOPHEN 10 MG/ML IV SOLN
INTRAVENOUS | Status: DC | PRN
  Administered 2013-07-09: 1000 mg via INTRAVENOUS

## 2013-07-09 MED ORDER — SODIUM CHLORIDE 0.9 % IJ SOLN: 3.0000 mL | Freq: Two times a day (BID) | INTRAMUSCULAR | Status: DC

## 2013-07-09 MED ORDER — MENTHOL 3 MG MT LOZG: 1.0000 | LOZENGE | OROMUCOSAL | Status: DC | PRN

## 2013-07-09 MED ORDER — ONDANSETRON HCL 4 MG PO TABS: 4.0000 mg | ORAL_TABLET | Freq: Three times a day (TID) | ORAL | Status: DC | PRN

## 2013-07-09 MED ORDER — PROPOFOL 10 MG/ML IV BOLUS
INTRAVENOUS | Status: DC | PRN
  Administered 2013-07-09: 200 mg via INTRAVENOUS

## 2013-07-09 MED ORDER — DOCUSATE SODIUM 100 MG PO CAPS: 100.0000 mg | ORAL_CAPSULE | Freq: Three times a day (TID) | ORAL | Status: DC | PRN

## 2013-07-09 MED ORDER — 0.9 % SODIUM CHLORIDE (POUR BTL) OPTIME
TOPICAL | Status: DC | PRN
  Administered 2013-07-09: 400 mL

## 2013-07-09 MED ORDER — METHOCARBAMOL 500 MG PO TABS
500.0000 mg | ORAL_TABLET | Freq: Four times a day (QID) | ORAL | Status: DC | PRN
  Administered 2013-07-10: 500 mg via ORAL
  Filled 2013-07-09: qty 1

## 2013-07-09 MED ORDER — POLYETHYLENE GLYCOL 3350 17 GM/SCOOP PO POWD: 17.0000 g | Freq: Every day | ORAL | Status: DC

## 2013-07-09 MED ORDER — LACTATED RINGERS IV SOLN
INTRAVENOUS | Status: DC
  Administered 2013-07-09: 85 mL/h via INTRAVENOUS

## 2013-07-09 MED ORDER — LACTATED RINGERS IV SOLN
INTRAVENOUS | Status: DC
  Administered 2013-07-09: 11:00:00 via INTRAVENOUS

## 2013-07-09 MED ORDER — ONDANSETRON HCL 4 MG/2ML IJ SOLN
INTRAMUSCULAR | Status: DC | PRN
  Administered 2013-07-09: 4 mg via INTRAVENOUS

## 2013-07-09 SURGICAL SUPPLY — 59 items
BLADE SURG ROTATE 9660 (MISCELLANEOUS) IMPLANT
BUR EGG ELITE 4.0 (BURR) IMPLANT
BUR MATCHSTICK NEURO 3.0 LAGG (BURR) IMPLANT
CANISTER SUCTION 2500CC (MISCELLANEOUS) ×2 IMPLANT
CLOTH BEACON ORANGE TIMEOUT ST (SAFETY) ×2 IMPLANT
CLSR STERI-STRIP ANTIMIC 1/2X4 (GAUZE/BANDAGES/DRESSINGS) ×2 IMPLANT
CORDS BIPOLAR (ELECTRODE) ×2 IMPLANT
COVER SURGICAL LIGHT HANDLE (MISCELLANEOUS) ×4 IMPLANT
CRADLE DONUT ADULT HEAD (MISCELLANEOUS) ×2 IMPLANT
DRAPE C-ARM 42X72 X-RAY (DRAPES) ×2 IMPLANT
DRAPE POUCH INSTRU U-SHP 10X18 (DRAPES) ×2 IMPLANT
DRAPE SURG 17X23 STRL (DRAPES) ×2 IMPLANT
DRAPE U-SHAPE 47X51 STRL (DRAPES) ×2 IMPLANT
DRSG MEPILEX BORDER 4X4 (GAUZE/BANDAGES/DRESSINGS) ×2 IMPLANT
DURAPREP 26ML APPLICATOR (WOUND CARE) ×2 IMPLANT
ELECT COATED BLADE 2.86 ST (ELECTRODE) ×2 IMPLANT
ELECT REM PT RETURN 9FT ADLT (ELECTROSURGICAL) ×2
ELECTRODE REM PT RTRN 9FT ADLT (ELECTROSURGICAL) ×1 IMPLANT
GLOVE BIOGEL PI IND STRL 7.0 (GLOVE) ×2 IMPLANT
GLOVE BIOGEL PI IND STRL 8.5 (GLOVE) ×1 IMPLANT
GLOVE BIOGEL PI INDICATOR 7.0 (GLOVE) ×2
GLOVE BIOGEL PI INDICATOR 8.5 (GLOVE) ×1
GLOVE ECLIPSE 8.5 STRL (GLOVE) ×2 IMPLANT
GLOVE SURG SS PI 6.5 STRL IVOR (GLOVE) ×2 IMPLANT
GLOVE SURG SS PI 7.0 STRL IVOR (GLOVE) ×1 IMPLANT
GOWN PREVENTION PLUS XXLARGE (GOWN DISPOSABLE) ×2 IMPLANT
GOWN STRL REIN XL XLG (GOWN DISPOSABLE) ×4 IMPLANT
INTERLOCK LRDTC CRVCL VBR 7MM (Bone Implant) IMPLANT
KIT BASIN OR (CUSTOM PROCEDURE TRAY) ×2 IMPLANT
KIT ROOM TURNOVER OR (KITS) ×2 IMPLANT
LORDOTIC CERVICAL VBR 7MM SM (Bone Implant) ×2 IMPLANT
NDL SPNL 18GX3.5 QUINCKE PK (NEEDLE) ×1 IMPLANT
NEEDLE SPNL 18GX3.5 QUINCKE PK (NEEDLE) ×2 IMPLANT
NS IRRIG 1000ML POUR BTL (IV SOLUTION) ×2 IMPLANT
PACK ORTHO CERVICAL (CUSTOM PROCEDURE TRAY) ×2 IMPLANT
PACK UNIVERSAL I (CUSTOM PROCEDURE TRAY) ×2 IMPLANT
PAD ARMBOARD 7.5X6 YLW CONV (MISCELLANEOUS) ×4 IMPLANT
PATTIES SURGICAL .25X.25 (GAUZE/BANDAGES/DRESSINGS) IMPLANT
PIN DISTRACTION 14 (PIN) ×2 IMPLANT
PLATE LEVEL 1 TI VECTRA 14MM (Plate) ×1 IMPLANT
PUTTY BONE DBX 2.5 MIS (Bone Implant) ×1 IMPLANT
RESTRAINT LIMB HOLDER UNIV (RESTRAINTS) ×2 IMPLANT
SCREW 4.0X14MM (Screw) ×8 IMPLANT
SCREW BN 14X4XSLF DRL VA SLF (Screw) IMPLANT
SPONGE INTESTINAL PEANUT (DISPOSABLE) ×2 IMPLANT
SPONGE SURGIFOAM ABS GEL 100 (HEMOSTASIS) ×2 IMPLANT
STRIP CLOSURE SKIN 1/2X4 (GAUZE/BANDAGES/DRESSINGS) ×1 IMPLANT
SURGIFLO TRUKIT (HEMOSTASIS) IMPLANT
SUT MNCRL AB 3-0 PS2 18 (SUTURE) ×2 IMPLANT
SUT SILK 2 0 (SUTURE)
SUT SILK 2-0 18XBRD TIE 12 (SUTURE) IMPLANT
SUT VIC AB 2-0 CT1 18 (SUTURE) ×2 IMPLANT
SYR BULB IRRIGATION 50ML (SYRINGE) ×2 IMPLANT
SYR CONTROL 10ML LL (SYRINGE) ×2 IMPLANT
TAPE CLOTH 4X10 WHT NS (GAUZE/BANDAGES/DRESSINGS) ×2 IMPLANT
TAPE UMBILICAL COTTON 1/8X30 (MISCELLANEOUS) ×2 IMPLANT
TOWEL OR 17X24 6PK STRL BLUE (TOWEL DISPOSABLE) ×2 IMPLANT
TOWEL OR 17X26 10 PK STRL BLUE (TOWEL DISPOSABLE) ×2 IMPLANT
WATER STERILE IRR 1000ML POUR (IV SOLUTION) ×2 IMPLANT

## 2013-07-09 NOTE — Transfer of Care (Signed)
Immediate Anesthesia Transfer of Care Note  Patient: Madison Stewart  Procedure(s) Performed: Procedure(s): ANTERIOR CERVICAL DECOMPRESSION/DISCECTOMY FUSION C5-6 (N/A)  Patient Location: PACU  Anesthesia Type:General  Level of Consciousness: awake, alert  and oriented  Airway & Oxygen Therapy: Patient Spontanous Breathing and Patient connected to nasal cannula oxygen  Post-op Assessment: Report given to PACU RN, Post -op Vital signs reviewed and stable and Patient moving all extremities  Post vital signs: Reviewed and stable  Complications: No apparent anesthesia complications

## 2013-07-09 NOTE — Anesthesia Preprocedure Evaluation (Addendum)
Anesthesia Evaluation  Patient identified by MRN, date of birth, ID band Patient awake    Reviewed: Allergy & Precautions, H&P , NPO status , Patient's Chart, lab work & pertinent test results  History of Anesthesia Complications (+) PONV  Airway Mallampati: I TM Distance: >3 FB Neck ROM: Full    Dental  (+) Teeth Intact and Dental Advisory Given   Pulmonary  breath sounds clear to auscultation        Cardiovascular negative cardio ROS  Rhythm:Regular Rate:Normal     Neuro/Psych  Headaches, Anxiety    GI/Hepatic negative GI ROS, Neg liver ROS,   Endo/Other    Renal/GU negative Renal ROS     Musculoskeletal   Abdominal   Peds  Hematology negative hematology ROS (+)   Anesthesia Other Findings   Reproductive/Obstetrics                          Anesthesia Physical Anesthesia Plan  ASA: II  Anesthesia Plan: General   Post-op Pain Management:    Induction: Intravenous  Airway Management Planned: Oral ETT  Additional Equipment:   Intra-op Plan:   Post-operative Plan: Extubation in OR  Informed Consent: I have reviewed the patients History and Physical, chart, labs and discussed the procedure including the risks, benefits and alternatives for the proposed anesthesia with the patient or authorized representative who has indicated his/her understanding and acceptance.   Dental advisory given  Plan Discussed with: Anesthesiologist  Anesthesia Plan Comments:         Anesthesia Quick Evaluation

## 2013-07-09 NOTE — Anesthesia Procedure Notes (Signed)
Procedure Name: Intubation Date/Time: 07/09/2013 12:19 PM Performed by: Orvilla Fus A Pre-anesthesia Checklist: Timeout performed, Patient identified, Emergency Drugs available, Suction available and Patient being monitored Patient Re-evaluated:Patient Re-evaluated prior to inductionOxygen Delivery Method: Circle system utilized Preoxygenation: Pre-oxygenation with 100% oxygen Intubation Type: IV induction Ventilation: Mask ventilation without difficulty Laryngoscope Size: Mac and 3 Grade View: Grade I Tube type: Oral Tube size: 7.0 mm Number of attempts: 1 Airway Equipment and Method: Stylet and LTA kit utilized Placement Confirmation: ETT inserted through vocal cords under direct vision,  breath sounds checked- equal and bilateral and positive ETCO2 Secured at: 20 cm Tube secured with: Tape Dental Injury: Teeth and Oropharynx as per pre-operative assessment

## 2013-07-09 NOTE — H&P (Signed)
No change in clinical exam H+P reviewed  

## 2013-07-09 NOTE — Anesthesia Postprocedure Evaluation (Signed)
  Anesthesia Post-op Note  Patient: Madison Stewart  Procedure(s) Performed: Procedure(s): ANTERIOR CERVICAL DECOMPRESSION/DISCECTOMY FUSION C5-6 (N/A)  Patient Location: PACU  Anesthesia Type:General  Level of Consciousness: awake, oriented, sedated and patient cooperative  Airway and Oxygen Therapy: Patient Spontanous Breathing  Post-op Pain: mild  Post-op Assessment: Post-op Vital signs reviewed, Patient's Cardiovascular Status Stable, Respiratory Function Stable, Patent Airway, No signs of Nausea or vomiting and Pain level controlled  Post-op Vital Signs: stable  Complications: No apparent anesthesia complications

## 2013-07-09 NOTE — Brief Op Note (Signed)
07/09/2013  1:57 PM  PATIENT:  Madison Stewart  37 y.o. female  PRE-OPERATIVE DIAGNOSIS:  CERVICAL SPONDYLOSIS  POST-OPERATIVE DIAGNOSIS:  CERVICAL SPONDYLOSIS  PROCEDURE:  Procedure(s): ANTERIOR CERVICAL DECOMPRESSION/DISCECTOMY FUSION C5-6 (N/A)  SURGEON:  Surgeon(s) and Role:    * Venita Lick, MD - Primary  PHYSICIAN ASSISTANT:   ASSISTANTS: none   ANESTHESIA:   general  EBL:  Total I/O In: 1800 [I.V.:1800] Out: 25 [Blood:25]  BLOOD ADMINISTERED:none  DRAINS: none   LOCAL MEDICATIONS USED:  MARCAINE     SPECIMEN:  No Specimen  DISPOSITION OF SPECIMEN:  N/A  COUNTS:  YES  TOURNIQUET:  * No tourniquets in log *  DICTATION: .Other Dictation: Dictation Number [id number]  PLAN OF CARE: Admit for overnight observation  PATIENT DISPOSITION:  PACU - hemodynamically stable.

## 2013-07-10 ENCOUNTER — Observation Stay (HOSPITAL_COMMUNITY): Payer: Worker's Compensation

## 2013-07-10 NOTE — Evaluation (Signed)
Occupational Therapy Evaluation Patient Details Name: Madison Stewart MRN: [id number] DOB: August 09, 1976 Today's Date: 07/10/2013 Time: 8295-6213 OT Time Calculation (min): 18 min  OT Assessment / Plan / Recommendation History of present illness 37 y.o. female admitted for ANTERIOR CERVICAL DECOMPRESSION/DISCECTOMY FUSION C5-6   Clinical Impression   Patient evaluated by Occupational Therapy with no further acute OT needs identified. All education has been completed and the patient has no further questions. Pt demonstrates good awareness of precautions. See below for any follow-up Occupational Therapy or equipment needs. OT is signing off. Thank you for this referral.     OT Assessment  Patient does not need any further OT services    Follow Up Recommendations  No OT follow up    Barriers to Discharge      Equipment Recommendations  None recommended by OT    Recommendations for Other Services    Frequency       Precautions / Restrictions Precautions Precautions: Cervical Required Braces or Orthoses: Cervical Brace Cervical Brace: Hard collar   Pertinent Vitals/Pain     ADL  Eating/Feeding: Modified independent Where Assessed - Eating/Feeding: Bed level Grooming: Wash/dry hands;Wash/dry face;Teeth care;Brushing hair;Supervision/safety Where Assessed - Grooming: Unsupported standing Upper Body Bathing: Supervision/safety Where Assessed - Upper Body Bathing: Unsupported sitting Lower Body Bathing: Supervision/safety Where Assessed - Lower Body Bathing: Unsupported sit to stand Upper Body Dressing: Supervision/safety Where Assessed - Upper Body Dressing: Unsupported sitting Lower Body Dressing: Supervision/safety Where Assessed - Lower Body Dressing: Unsupported sit to stand Toilet Transfer: Supervision/safety Toilet Transfer Method: Sit to stand;Stand pivot Acupuncturist: Regular height toilet Toileting - Clothing Manipulation and Hygiene:  Supervision/safety Where Assessed - Engineer, mining and Hygiene: Standing Tub/Shower Transfer: Supervision/safety Tub/Shower Transfer Method: Science writer: Shower seat with back Equipment Used: Other (comment) (cervical collar) Transfers/Ambulation Related to ADLs: supervision ADL Comments: Pt and spouse were instructed in cervical precautions.  Spouse able to assit pt with donning/doffing brace.  Instructed them how to change pads on collar, and they were able to verbalize understanding.  Pt able to teach back all info.     OT Diagnosis:    OT Problem List:   OT Treatment Interventions:     OT Goals(Current goals can be found in the care plan section)    Visit Information  Last OT Received On: 07/10/13 Assistance Needed: +1 History of Present Illness: 37 y.o. 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;Other (comment) (But able to communicate effectively in English) Dominant Hand: Right         Vision/Perception     Cognition  Cognition Arousal/Alertness: Awake/alert Behavior During Therapy: WFL for tasks assessed/performed Overall Cognitive Status: Within Functional Limits for tasks assessed    Extremity/Trunk Assessment Upper Extremity Assessment Upper Extremity Assessment: Overall WFL for tasks assessed (within in confines of precautions)     Mobility Bed Mobility Bed Mobility: Rolling Right;Right Sidelying to Sit;Sitting - Scoot to [employer] of Bed Rolling Right: 5: Supervision Right Sidelying to Sit:  3: Mod assist;HOB flat Sitting - Scoot to Edge of Bed: 5: Supervision Details for Bed Mobility Assistance: assist to lift trunk from bed due to neck discomfort.  Husband  able to safely assist Transfers Transfers: Sit to Stand;Stand to Sit Sit to Stand: 6: Modified independent (Device/Increase time) Stand to Sit: 6: Modified independent (Device/Increase time)     Exercise     Balance     End of Session OT - End of Session Equipment Utilized During Treatment: Cervical collar Activity Tolerance: Patient tolerated treatment well Patient left: with family/visitor present;in bed (EOB) Nurse Communication: Mobility status;Patient requests pain meds  GO     Adaliz Dobis, Ursula Alert M 07/10/2013, 1:08 PM

## 2013-07-10 NOTE — Evaluation (Signed)
Physical Therapy Evaluation Patient Details Name: Madison Stewart MRN: [id number] DOB: 27-Nov-1976 Today's Date: 07/10/2013 Time: 4098-1191 PT Time Calculation (min): 17 min  PT Assessment / Plan / Recommendation History of Present Illness  37 y.o. female admitted for ANTERIOR CERVICAL DECOMPRESSION/DISCECTOMY FUSION C5-6  Clinical Impression  Pt. Presents to PT with good functional mobility and gait, though a little dizzy when first up in hallway.  She was able to continue on to practice steps with dizziness having resolved.  She will have 24 hour assist at home and all education is complete.  She is for DC in the next little while, therefore PT will sign off. No need for DME or HHPT at this time.    PT Assessment  Patent does not need any further PT services    Follow Up Recommendations  No PT follow up    Does the patient have the potential to tolerate intense rehabilitation      Barriers to Discharge        Equipment Recommendations  None recommended by PT    Recommendations for Other Services     Frequency      Precautions / Restrictions Precautions Precautions: Cervical Precaution Comments: provided pt. cervical precautions/body mechanics handout and educated her accordingly Required Braces or Orthoses: Cervical Brace Cervical Brace: Hard collar   Pertinent Vitals/Pain See vitals tab       Mobility  Bed Mobility Bed Mobility: Not assessed (pt. seated at edge of bed) Rolling Right: 5: Supervision Right Sidelying to Sit: 3: Mod assist;HOB flat Sitting - Scoot to Edge of Bed: 5: Supervision Details for Bed Mobility Assistance: assist to lift trunk from bed due to neck discomfort.  Husband able to safely assist Transfers Transfers: Sit to Stand;Stand to Sit Sit to Stand: 6: Modified independent (Device/Increase time);From bed Stand to Sit: 6: Modified independent (Device/Increase time);To bed Details for Transfer Assistance: managing  transitions well with no  need for physical assist Ambulation/Gait Ambulation/Gait Assistance: 6: Modified independent (Device/Increase time) Ambulation Distance (Feet): 100 Feet Assistive device: None Ambulation/Gait Assistance Details: Pt. ambulated with no overt LOB until she became slightly dizzy (possibly from pain med).  she was returned to room and was pushed to PT gym in recliner for practice on steps. Gait Pattern: Within Functional Limits Stairs: Yes Stairs Assistance: 6: Modified independent (Device/Increase time) Stair Management Technique: Two rails;Alternating pattern;Forwards Number of Stairs: 3    Exercises     PT Diagnosis:    PT Problem List:   PT Treatment Interventions:       PT Goals(Current goals can be found in the care plan section)    Visit Information  Last PT Received On: 07/10/13 Assistance Needed: +1 History of Present Illness: 37 y.o. 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;Other (comment) (but communicated effectively in [place]) Dominant Hand: Right    Cognition  Cognition Arousal/Alertness: Awake/alert Behavior During Therapy: WFL for tasks assessed/performed Overall Cognitive Status: Within Functional Limits for tasks assessed    Extremity/Trunk Assessment Upper Extremity Assessment Upper Extremity Assessment: Overall WFL for tasks assessed  Lower Extremity Assessment Lower Extremity  Assessment: Overall WFL for tasks assessed   Balance    End of Session PT - End of Session Equipment Utilized During Treatment: Gait belt;Cervical collar Activity Tolerance: Patient tolerated treatment well;Other (comment) (slight dizziness on first walk in hallway, cleared afterward) Patient left: in chair;with call bell/phone within reach;with family/visitor present Nurse Communication: Mobility status  GP Functional Assessment Tool Used: clinical observation Functional Limitation: Mobility: Walking and moving around Mobility: Walking and Moving Around Current Status ([id number]): At least 1 percent but less than 20 percent impaired, limited or restricted Mobility: Walking and Moving Around Goal Status [phone]): At least 1 percent but less than 20 percent impaired, limited or restricted Mobility: Walking and Moving Around Discharge Status [phone]): At least 1 percent but less than 20 percent impaired, limited or restricted   Ferman Hamming 07/10/2013, 2:10 PM Weldon Picking PT Acute Rehab Services [phone] [facility] [phone]

## 2013-07-10 NOTE — Op Note (Signed)
Madison Stewart, Madison Stewart NO.:  [id number]  MEDICAL RECORD NO.:  [id number]  LOCATION:  [id number]                        FACILITY:  [facility]  PHYSICIAN:  Alvy Beal, MD    DATE OF BIRTH:  05-Apr-1976  DATE OF PROCEDURE:  07/09/2013 DATE OF DISCHARGE:                              OPERATIVE REPORT   PREOPERATIVE DIAGNOSIS:  Cervical spondylitic radiculopathy.  POSTOPERATIVE DIAGNOSIS:  Cervical spondylitic radiculopathy.  PROCEDURE:  Anterior cervical diskectomy and fusion, C5-6.  CLINICAL IMPRESSION:  This is a very pleasant woman who presents with an obvious local kyphotic deformity of the neck with significant neck and radicular arm pain.  Conservative management had failed to alleviate her symptoms and so we elected to proceed with surgery.  All appropriate risks, benefits, and alternatives were discussed with the patient and consent was obtained.  INSTRUMENTATION SYSTEM USED:  Synthes anterior cervical Vectra plate, size 12 with 14 mm locking screws with a 7 small titanium intervertebral lordotic cage packed with DBX mix.  OPERATIVE PROCEDURE:  The patient was brought to the operating room, placed supine on the operating table.  After successful induction of general anesthesia and endotracheal intubation, TEDs and SCDs were applied.  Rolled towels were placed behind the shoulder blades.  The anterior cervical spine was then prepped and draped in a standard fashion.  A time-out was done to confirm patient, procedure, and all other pertinent important data.  Once this was done, x-ray was used to identify the landmarks for the incision.  Incision site was infiltrated with 0.25% Marcaine and then a midline transverse incision was made starting at the midline and proceeding to the left.  Sharp dissection was carried out down to and through the platysma.  This was a standard Clementeen Graham approach to the anterior cervical spine.  I dissected down through the  platysma along the medial border of the sternocleidomastoid sweeping the omohyoid, trachea and esophagus to the right and protecting and sweeping the carotid sheath laterally with a finger.  I then identified the anterior cervical spine.  I then placed a needle into the C5-6 disk space, took an x-ray, confirmed I was at the appropriate level.  Once this was done, I then mobilized the longus coli muscles from the midbody of C5 to the midbody of C6 bilaterally.  I placed my self-retaining retractor underneath the longus coli muscle, deflated the endotracheal cuff, expanded the retractors, and reinflated the endotracheal cuff.  I then incised the annulus with a 15 blade scalpel, and then used a pituitary rongeur, curettes, and Kerrison rongeurs to remove the bulk of the disk material.  I then used a 2-mm Kerrison to resect down the osteophytes from the anterior aspect of the C5 vertebral body.  Once this was done, I then placed distraction pins into the bodies of C5 and C6, distracted the space and maintained it with the distraction pins.  I continued my dissection posteriorly going towards the anulus.  I then released the annulus and identified the posterior longitudinal ligament.  Using a fine nerve hook, I developed a plane underneath the posterior longitudinal ligament.  I then used a 1- mm Kerrison to resect the posterior longitudinal ligament.  Osteophytes from the posterior  aspect of the C5 and C6 vertebral bodies were taken down with a 1 mm Kerrison.  The uncovertebral joint was also debrided of the osteophyte.  At this point, I had an excellent decompression.  I rasped the endplates and then trialed with individual spacers.  I elected to use a 7 small to provide the best fit.  The implant was obtained, packed with DBX mix, and malleted to the appropriate depth. Distraction pins were removed and then a size 12 anterior cervical Vectra plate was contoured and applied to the anterior  surface of the cervical spine.  It was fixed with self-drilling screws into the bodies of C5 and C6.  They all had excellent purchase and torqued down such that the expanding ring expanded and then collapsed to lock the screws in place.  I then irrigated the wound copiously with normal saline, obtained hemostasis using bipolar electrocautery.  Once this was done, I returned the trachea and esophagus and I closed the platysma with interrupted 2-0 Vicryl sutures, and then the skin with 3-0 Monocryl. Steri-Strips, dry dressing, and Aspen collar were applied.  The patient was extubated, transferred to PACU without incident.  At the end of the case, all needle and sponge counts were correct.  No adverse intraoperative events.     Alvy Beal, MD     DDB/MEDQ  D:  07/09/2013  T:  07/10/2013  Job:  [id number]

## 2013-07-10 NOTE — Progress Notes (Signed)
SW received a consult for possible placement. PT  At this time is recommending no PT follow up. Clinical Social Worker will sign off for now as social work intervention is no longer needed. Please consult us again if new need arises.   Ivianna Notch, MSW [phone] 

## 2013-07-10 NOTE — Progress Notes (Signed)
    Subjective: Procedure(s) (LRB): ANTERIOR CERVICAL DECOMPRESSION/DISCECTOMY FUSION C5-6 (N/A) 1 Day Post-Op  Patient reports pain as 2 on 0-10 scale.  Reports decreased arm pain reports incisional neck pain   Positive void Negative bowel movement Positive flatus Negative chest pain or shortness of breath  Objective: Vital signs in last 24 hours: Temp:  [97.3 F (36.3 C)-98.8 F (37.1 C)] 98.8 F (37.1 C) (08/08 0500) Pulse Rate:  [59-85] 83 (08/08 0500) Resp:  [11-20] 16 (08/08 0500) BP: (92-138)/(45-92) 92/45 mmHg (08/08 0500) SpO2:  [97 %-100 %] 97 % (08/08 0500) Weight:  [73.029 kg (161 lb)] 73.029 kg (161 lb) (08/07 1536)  Intake/Output from previous day: 08/07 0701 - 08/08 0700 In: 3520 [P.O.:360; I.V.:3010; IV Piggyback:150] Out: 25 [Blood:25]  Labs:  Recent Labs  07/08/13 1508  WBC 11.0*  RBC 4.52  HCT 35.8*  PLT 441*   No results found for this basename: NA, K, CL, CO2, BUN, CREATININE, GLUCOSE, CALCIUM,  in the last 72 hours No results found for this basename: LABPT, INR,  in the last 72 hours  Physical Exam: Neurologically intact ABD soft Neurovascular intact Intact pulses distally Incision: dressing C/D/I and no drainage Compartment soft  Assessment/Plan: Patient stable  xrays satisfactory  Mobilization with physical therapy Encourage incentive spirometry Continue care  Advance diet Up with therapy D/C IV fluids Discharge home with home health if needed  Venita Lick, MD [facility] [phone]

## 2013-07-10 NOTE — Discharge Summary (Signed)
Patient ID: Madison Stewart MRN: [id number] DOB/AGE: [birth year] 37 y.o.  Admit date: 07/09/2013 Discharge date: 07/10/2013  Admission Diagnoses:  Active Problems:   * No active hospital problems. *   Discharge Diagnoses:  Active Problems:   * No active hospital problems. *  status post Procedure(s): ANTERIOR CERVICAL DECOMPRESSION/DISCECTOMY FUSION C5-6  Past Medical History  Diagnosis Date  . Missed ab     X 2  . PONV (postoperative nausea and vomiting)   . [id number](478.2)     Surgeries: Procedure(s): ANTERIOR CERVICAL DECOMPRESSION/DISCECTOMY FUSION C5-6 on 07/09/2013   Consultants:   none  Discharged Condition: Improved  Hospital Course: Madison Stewart is an 37 y.o. female who was admitted 07/09/2013 for operative treatment of <principal problem not specified>. Patient failed conservative treatments (please see the history and physical for the specifics) and had severe unremitting pain that affects sleep, daily activities and work/hobbies. After pre-op clearance, the patient was taken to the operating room on 07/09/2013 and underwent  Procedure(s): ANTERIOR CERVICAL DECOMPRESSION/DISCECTOMY FUSION C5-6.    Patient was given perioperative antibiotics: Anti-infectives   Start     Dose/Rate Route Frequency Ordered Stop   07/09/13 1630  ceFAZolin (ANCEF) IVPB 1 g/50 mL premix     1 g 100 mL/hr over 30 Minutes Intravenous Every 8 hours 07/09/13 1618 07/10/13 0005   07/08/13 1324  ceFAZolin (ANCEF) IVPB 2 g/50 mL premix     2 g 100 mL/hr over 30 Minutes Intravenous 30 min pre-op 07/08/13 1324 07/09/13 1222       Patient was given sequential compression devices and early ambulation to prevent DVT.   Patient benefited maximally from hospital stay and there were no complications. At the time of discharge, the patient was urinating/moving their bowels without difficulty, tolerating a regular diet, pain is controlled with oral pain medications and they have been cleared  by PT/OT.   Recent vital signs: Patient Vitals for the past 24 hrs:  BP Temp Temp src Pulse Resp SpO2 Height Weight  07/10/13 0500 92/45 mmHg 98.8 F (37.1 C) - 83 16 97 % - -  07/10/13 0017 105/56 mmHg 98.3 F (36.8 C) - 81 16 98 % - -  07/09/13 2118 117/62 mmHg 98.2 F (36.8 C) - 67 16 98 % - -  07/09/13 1620 124/63 mmHg 97.7 F (36.5 C) Oral 73 16 99 % - -  07/09/13 1536 - 98.1 F (36.7 C) - - - - 5\' 1"  (1.549 m) 73.029 kg (161 lb)  07/09/13 1530 - - - 67 14 100 % - -  07/09/13 1525 109/58 mmHg - - 78 16 100 % - -  07/09/13 1515 - - - 77 12 100 % - -  07/09/13 1510 113/66 mmHg - - 74 15 100 % - -  07/09/13 1500 - - - 67 18 99 % - -  07/09/13 1455 117/59 mmHg - - 67 14 100 % - -  07/09/13 1445 - - - 59 13 100 % - -  07/09/13 1440 133/64 mmHg - - 75 14 100 % - -  07/09/13 1430 - - - 65 16 100 % - -  07/09/13 1425 122/67 mmHg - - 81 15 100 % - -  07/09/13 1415 - - - 67 17 100 % - -  07/09/13 1410 - - - 81 - 100 % - -  07/09/13 1409 125/70 mmHg 97.7 F (36.5 C) - 78 11 100 % - -  07/09/13 0952 138/92 mmHg  97.3 F (36.3 C) Oral 85 20 100 % - -     Recent laboratory studies:  Recent Labs  07/08/13 1508  WBC 11.0*  HGB 11.9*  HCT 35.8*  PLT 441*     Discharge Medications:     Medication List    STOP taking these medications       ibuprofen 600 MG tablet  Commonly known as:  ADVIL,MOTRIN     metaxalone 800 MG tablet  Commonly known as:  SKELAXIN      TAKE these medications       docusate sodium 100 MG capsule  Commonly known as:  COLACE  Take 1 capsule (100 mg total) by mouth 3 (three) times daily as needed for constipation.     methocarbamol 500 MG tablet  Commonly known as:  ROBAXIN  Take 1 tablet (500 mg total) by mouth 3 (three) times daily as needed.     ondansetron 4 MG tablet  Commonly known as:  ZOFRAN  Take 1 tablet (4 mg total) by mouth every 8 (eight) hours as needed for nausea.     oxyCODONE-acetaminophen 10-325 MG per tablet  Commonly  known as:  PERCOCET  Take 1 tablet by mouth every 4 (four) hours as needed for pain.     polyethylene glycol powder powder  Commonly known as:  GLYCOLAX  Take 17 g by mouth daily.        Diagnostic Studies: Dg Cervical Spine 2-3 Views  07/09/2013   *RADIOLOGY REPORT*  Clinical Data: Cervical fusion  DG C-ARM 1-60 MIN,CERVICAL SPINE - 2-3 VIEW  Comparison:   the previous day's study  Findings: Two intraoperative fluoroscopic spot images document changes of instrumented ACDF C5-6, hardware intact. Normal alignment.  The cervicothoracic junction is not well seen on the lateral image.  IMPRESSION:  ACDF C5-6   Original Report Authenticated By: D. Andria Rhein, MD   Dg Cervical Spine 2 Or 3 Views  07/08/2013   *RADIOLOGY REPORT*  Clinical Data: Preop for cervical fusion  CERVICAL SPINE - 2-3 VIEW  Comparison: None  Findings: Two views of the cervical spine submitted.  There is reversal of the cervical lordosis.  Mild degenerative changes C1-C2 articulation.  No acute fracture or subluxation.  Mild disc space flattening with mild anterior and mild posterior spurring at C5-C6 level.  Minimal disc space flattening at C 7-T1 level.  No prevertebral soft tissue swelling.  Cervical airway is patent.  IMPRESSION: There is reversal of the cervical lordosis.  Mild degenerative changes C1-C2 articulation.  No acute fracture or subluxation. Mild disc space flattening with mild anterior and mild posterior spurring at C5-C6 level.  Minimal disc space flattening at C 7-T1 level.  No prevertebral soft tissue swelling.  Cervical airway is patent.   Original Report Authenticated By: Natasha Mead, M.D.   Dg C-arm 1-60 Min  07/09/2013   *RADIOLOGY REPORT*  Clinical Data: Cervical fusion  DG C-ARM 1-60 MIN,CERVICAL SPINE - 2-3 VIEW  Comparison:   the previous day's study  Findings: Two intraoperative fluoroscopic spot images document changes of instrumented ACDF C5-6, hardware intact. Normal alignment.  The cervicothoracic  junction is not well seen on the lateral image.  IMPRESSION:  ACDF C5-6   Original Report Authenticated By: D. Andria Rhein, MD          Follow-up Information   Follow up with Alvy Beal, MD. Schedule an appointment as soon as possible for a visit in 2 weeks.   Contact information:  [address] [place] [address] [phone]       Discharge Plan:  discharge to home  Disposition: stable    Signed: [place] D for Dr. Venita Lick [facility] [phone] 07/10/2013, 7:44 AM

## 2013-07-13 ENCOUNTER — Encounter (HOSPITAL_COMMUNITY): Payer: Self-pay | Admitting: Orthopedic Surgery

## 2013-07-13 NOTE — Progress Notes (Signed)
Occupational Therapy Evaluation Addendum for 2013/07/31   July 31, 2013 1300  OT G-codes **NOT FOR INPATIENT CLASS**  Functional Limitation Self care  Self Care Current Status [phone]) CI  Self Care Goal Status ([id number]) CI  Self Care Discharge Status [phone]) CI  Jeani Hawking, OTR/L [phone]

## 2014-02-22 ENCOUNTER — Encounter: Payer: Self-pay | Admitting: Gynecology

## 2014-03-18 ENCOUNTER — Encounter: Payer: Self-pay | Admitting: Gynecology

## 2014-03-18 ENCOUNTER — Other Ambulatory Visit (HOSPITAL_COMMUNITY)
Admission: RE | Admit: 2014-03-18 | Discharge: 2014-03-18 | Disposition: A | Discharge status: Home / Self Care | Payer: BC Managed Care – PPO | Source: Ambulatory Visit | Attending: Gynecology | Admitting: Gynecology

## 2014-03-18 ENCOUNTER — Ambulatory Visit (INDEPENDENT_AMBULATORY_CARE_PROVIDER_SITE_OTHER): Payer: BC Managed Care – PPO | Admitting: Gynecology

## 2014-03-18 ENCOUNTER — Other Ambulatory Visit: Payer: Self-pay | Admitting: Gynecology

## 2014-03-18 VITALS — BP 116/78 | Ht 61.0 in | Wt 161.0 lb

## 2014-03-18 DIAGNOSIS — Z01419 Encounter for gynecological examination (general) (routine) without abnormal findings: Secondary | ICD-10-CM | POA: Insufficient documentation

## 2014-03-18 DIAGNOSIS — N921 Excessive and frequent menstruation with irregular cycle: Secondary | ICD-10-CM

## 2014-03-18 DIAGNOSIS — Z23 Encounter for immunization: Secondary | ICD-10-CM

## 2014-03-18 DIAGNOSIS — Z1151 Encounter for screening for human papillomavirus (HPV): Secondary | ICD-10-CM | POA: Insufficient documentation

## 2014-03-18 DIAGNOSIS — Z975 Presence of (intrauterine) contraceptive device: Secondary | ICD-10-CM

## 2014-03-18 DIAGNOSIS — D649 Anemia, unspecified: Secondary | ICD-10-CM

## 2014-03-18 LAB — CBC WITH DIFFERENTIAL/PLATELET
BASOS PCT: 1 % (ref 0–1)
Basophils Absolute: 0.1 10*3/uL (ref 0.0–0.1)
Eosinophils Absolute: 0.1 10*3/uL (ref 0.0–0.7)
Eosinophils Relative: 1 % (ref 0–5)
HEMATOCRIT: 32.6 % — AB (ref 36.0–46.0)
Hemoglobin: 10.2 g/dL — ABNORMAL LOW (ref 12.0–15.0)
LYMPHS ABS: 2 10*3/uL (ref 0.7–4.0)
LYMPHS PCT: 21 % (ref 12–46)
MCH: 21.7 pg — AB (ref 26.0–34.0)
MCHC: 31.3 g/dL (ref 30.0–36.0)
MCV: 69.2 fL — AB (ref 78.0–100.0)
MONOS PCT: 7 % (ref 3–12)
Monocytes Absolute: 0.7 10*3/uL (ref 0.1–1.0)
NEUTROS ABS: 6.7 10*3/uL (ref 1.7–7.7)
NEUTROS PCT: 70 % (ref 43–77)
PLATELETS: 490 10*3/uL — AB (ref 150–400)
RBC: 4.71 MIL/uL (ref 3.87–5.11)
RDW: 18.6 % — AB (ref 11.5–15.5)
WBC: 9.6 10*3/uL (ref 4.0–10.5)

## 2014-03-18 LAB — URINALYSIS W MICROSCOPIC + REFLEX CULTURE
Bilirubin Urine: NEGATIVE
CRYSTALS: NONE SEEN
Casts: NONE SEEN
Glucose, UA: NEGATIVE mg/dL
Hgb urine dipstick: NEGATIVE
Ketones, ur: NEGATIVE mg/dL
NITRITE: NEGATIVE
Protein, ur: NEGATIVE mg/dL
RBC / HPF: NONE SEEN RBC/hpf (ref <3)
SPECIFIC GRAVITY, URINE: 1.021 (ref 1.005–1.030)
UROBILINOGEN UA: 0.2 mg/dL (ref 0.0–1.0)
pH: 5.5 (ref 5.0–8.0)

## 2014-03-18 LAB — COMPREHENSIVE METABOLIC PANEL
ALBUMIN: 4.2 g/dL (ref 3.5–5.2)
ALK PHOS: 55 U/L (ref 39–117)
ALT: 17 U/L (ref 0–35)
AST: 16 U/L (ref 0–37)
BUN: 11 mg/dL (ref 6–23)
CHLORIDE: 103 meq/L (ref 96–112)
CO2: 22 mEq/L (ref 19–32)
Calcium: 9.2 mg/dL (ref 8.4–10.5)
Creat: 0.71 mg/dL (ref 0.50–1.10)
GLUCOSE: 93 mg/dL (ref 70–99)
POTASSIUM: 4.1 meq/L (ref 3.5–5.3)
SODIUM: 134 meq/L — AB (ref 135–145)
TOTAL PROTEIN: 7.8 g/dL (ref 6.0–8.3)
Total Bilirubin: 0.4 mg/dL (ref 0.2–1.2)

## 2014-03-18 LAB — TSH: TSH: 0.815 u[IU]/mL (ref 0.350–4.500)

## 2014-03-18 LAB — LIPID PANEL
Cholesterol: 128 mg/dL (ref 0–200)
HDL: 50 mg/dL (ref >39)
LDL CALC: 64 mg/dL (ref 0–99)
TRIGLYCERIDES: 68 mg/dL (ref <150)
Total CHOL/HDL Ratio: 2.6 Ratio
VLDL: 14 mg/dL (ref 0–40)

## 2014-03-18 NOTE — Progress Notes (Signed)
Madison Stewart 12/12/1975 [id number]   History:    38 y.o.  for annual gyn exam with a complaint of intermenstrual bleeding for the past 4 months. Patient had a Mirena IUD placed in favor 2014. Patient recently had right shoulder reconstruction whereby she had repair of a rotator cuff and is currently on the sling limiting examination of the right breast. Patient does state that she does her monthly breast exams. Patient denies any prior history of abnormal Pap smears.  Past medical history,surgical history, family history and social history were all reviewed and documented in the EPIC chart.  Gynecologic History Patient's last menstrual period was 02/22/2014. Contraception: IUD Last Pap: 2012. Results were: normal Last mammogram: Not indicated. Results were: Not indicated  Obstetric History OB History  Gravida Para Term Preterm AB SAB TAB Ectopic Multiple Living  4 2 2  2 2    2     # Outcome Date GA Lbr Len/2nd Weight Sex Delivery Anes PTL Lv  4 TRM     F SVD  N Y  3 SAB           2 SAB           1 TRM     M SVD  N Y       ROS: A ROS was performed and pertinent positives and negatives are included in the history.  GENERAL: No fevers or chills. HEENT: No change in vision, no earache, sore throat or sinus congestion. NECK: No pain or stiffness. CARDIOVASCULAR: No chest pain or pressure. No palpitations. PULMONARY: No shortness of breath, cough or wheeze. GASTROINTESTINAL: No abdominal pain, nausea, vomiting or diarrhea, melena or bright red blood per rectum. GENITOURINARY: No urinary frequency, urgency, hesitancy or dysuria. MUSCULOSKELETAL: No joint or muscle pain, no back pain, no recent trauma. DERMATOLOGIC: No rash, no itching, no lesions. ENDOCRINE: No polyuria, polydipsia, no heat or cold intolerance. No recent change in weight. HEMATOLOGICAL: No anemia or easy bruising or bleeding. NEUROLOGIC: No headache, seizures, numbness, tingling or weakness. PSYCHIATRIC: No  depression, no loss of interest in normal activity or change in sleep pattern.     Exam: chaperone present  Ht 5\' 1"  (1.549 m)  Wt 161 lb (73.029 kg)  BMI 30.44 kg/m2  LMP 02/22/2014  Body mass index is 30.44 kg/(m^2).  General appearance : Well developed well nourished female. No acute distress HEENT: Neck supple, trachea midline, no carotid bruits, no thyroidmegaly Lungs: Clear to auscultation, no rhonchi or wheezes, or rib retractions  Heart: Regular rate and rhythm, no murmurs or gallops Breast:Examined in sitting and supine position were symmetrical in appearance, no palpable masses or tenderness,  no skin retraction, no nipple inversion, no nipple discharge, no skin discoloration, no axillary or supraclavicular lymphadenopathy Abdomen: no palpable masses or tenderness, no rebound or guarding Extremities: no edema or skin discoloration or tenderness  Pelvic:  Bartholin, Urethra, Skene Glands: Within normal limits             Vagina: No gross lesions or discharge  Cervix: No gross lesions or discharge, IUD string seen  Uterus  anteverted, normal size, shape and consistency, non-tender and mobile  Adnexa  Without masses or tenderness  Anus and perineum  normal   Rectovaginal  normal sphincter tone without palpated masses or tenderness             Hemoccult not indicated   Patient was counseled. The cervix was cleansed with Betadine solution. A sterile Pipelle was introduced  into the uterine cavity for an endometrial biopsy. Tissue was submitted for histological evaluation.  Assessment/Plan:  38 y.o. female for annual exam with four-month history of intermenstrual bleeding although she does have a Marine IUD due to the fact that she is 38 years of age we did an endometrial biopsy to rule out any endometrial hyperplasia or endometrial cancer. Patient returned back to the office next week for sonohysterogram for better assessment of the intrauterine cavity. Of note prior to  endometrial biopsy her Pap smear was done. The following labs were or: CBC, comprehensive metabolic panel, fasting lipid profile, TSH, urinalysis.  Note: This dictation was prepared with  Dragon/digital dictation along withSmart phrase technology. Any transcriptional errors that result from this process are unintentional.   Terrance Mass MD, 8:38 AM 03/18/2014

## 2014-03-18 NOTE — Addendum Note (Signed)
Addended by: Alen Blew on: 03/18/2014 10:53 AM   Modules accepted: Orders

## 2014-03-18 NOTE — Patient Instructions (Signed)
Ecografa transvaginal (Transvaginal Ultrasound) La ecografa transvaginal es una ecografa plvica en la que se utiliza una probeta metlica que se coloca en la vagina, para observar los rganos femeninos. El ecgrafo enva ondas sonoras desde un transductor (sonda). Estas ondas sonoras chocan contra las estructuras del cuerpo (como un eco) y crean Proofreader. La imagen se observa en un monitor. Se denomina transvaginal debido a que la sonda se inserta dentro de la vagina. Puede haber una pequea molestia por la introduccin de la sonda. Esta prueba tambin puede realizarse [employer]. La ecografa endovaginal es otro nombre para la ecografa transvaginal. En una ecografa transabdominal, la sonda se coloca en la parte externa del abdomen. Este mtodo no ofrece imgenes tan buenas como la tcnica transvaginal. La ecogafa transvaginal se utiliza para observar alteraciones en el tracto genital femenino. Entre ellos se incluyen:  Problemas de infertilidad.  Malformaciones congnitas (defecto de nacimiento) del tero y los ovarios.  Tumores en el tero.  Hemorragias anormales.  Tumores y quistes de ovario.  Abscesos (tejidos inflamados y pus) en la pelvis.  Dolor abdominal o plvico sin causa aparente.  Infecciones plvicas. DURANTE EL EMBARAZO, SE UTILIZA PAR OBSERVAR:  Embarazos normales.  Un embarazo ectpico (embarazo fuera del tero).  Latidos cardacos fetales.  Anormalidades de la pelvis que no se observan bien con la ecografa transabdominal.  Sospecha de gemelos o embarazo mltiple.  Aborto inminente.  Problemas en el cuello del tero (cuello incompetente, no permanece cerrado para contener al beb).  Cuando se realiza una amniocentesis (se retira lquido de la bolsa [place], para ser [place]).  Al buscar anormalidades en el beb.  Para controlar el crecimiento, el desarrollo y la edad del feto.  Para medir la cantidad de lquido en el saco  amnitico.  Cuando se realiza una versin externa del beb (se lo mueve a Programmer, applications).  Evaluar al beb en embarazos de alto riesgo (perfil biofsico).  Si se sospecha el deceso del beb (muerte). En algunos casos, se utiliza un mtodo especial denominado ecografa con infusin salina, para una observacin ms precisa del tero. Se inyecta solucin salina (agua con sal) dentro del tero en pacientes no embarazadas para observar mejor su interior. Este mtodo no se Designer, fashion/clothing. La probeta tambin puede usarse para obtener biopsias de Educational psychologist, para drenar lquido de quistes de ovario y para Designer, jewellery un DIU (dispositivo intrauterino para el control de la natalidad) que no pueda [place]. PREPARACIN PARA LA PRUEBA La ecografa transvaginal se realiza con la vejiga vaca. La ecografa transabdominal se realiza con la vejiga llena. Podrn solicitarle que beba varios vasos de agua antes del examen. En algunos casos se realiza una ecografa transabdominal antes de la ecografa transvaginal para obervar los rganos del abdomen. PROCEDIMIENTO  Deber acostarse en una cama, con las rodillas dobladas y los pies en los estribos. La probeta se cubre con un condn. Dentro de la vagina y en la probeta se aplica un lubricante estril. El lubricante ayuda a transmitir las ondas sonoras y Fish farm manager la irritacin de la vagina. El mdico mover la sonda en el interior de la cavidad vaginal para escanear las estructuras plvicas. Un examen normal mostrar una pelvis normal y contenidos normales en su interior. Una prueba anormal mostrar anormalidades en la pelvis, la placenta o el beb. LAS CAUSAS DE UN RESULTADO ANORMAL PUEDEN SER:  Crecimientos o tumores en:  El tero.  Los ovarios.  La vagina.  Otras estructuras plvicas.  Crecimientos no cancerosos  en el tero y los ovarios.  El ovario se retuerce y se corta el suministro de [place]  Las reas de  infeccin incluyen:  Enfermedad inflamatoria plvica.  Un absceso en la pelvis.  Ubicacin de un DIU. LOS PROBLEMAS QUE PUEDEN HALLARSE EN UNA MUJER EMBARAZADA SON:  Embarazo ectpico (embarazo fuera del tero).  Embarazos mltiples.  Dilatacin (apertura) precoz anormal del cuello del tero. Esto puede indicar un cuello incompetente y Biomedical scientist.  Aborto inminente.  Muerte fetal.  Los problemas con la placenta incluyen:  La placenta se ha desarrollado sobre la abertura del cuello del tero (placenta previa).  La placenta se ha separado anticipadamente en el tero (abrupcin placentaria).  La placenta se desarrolla en el msculo del tero (placenta acreta).  Tumores del Media planner, incluyendo la enfermedad trofoblstica gestacional. Se trata de un embarazo anormal en el que no hay feto. El tero se llena de quistes similares a uvas que en algunos casos son cancerosos.  Posicin incorrecta del feto (de nalgas, de vrtice).  Retraso del desarrollo fetal intrauterino (escaso desarrollo en el tero).  Anormalidades o infeccin fetal. RIESGOS Y COMPLICACIONES No hay riesgos conocidos para la ecografa. No se toman radiografas cuando se realiza una ecografa. Document Released: 03/07/2009 Document Revised: 02/11/2012 [facility] Patient Information 2014 [place], [place]. Biopsia de endometrio (Endometrial Biopsy) La biopsia de endometrio es un procedimiento en el que se toma una [place] de tejido del tero. Luego la muestra de tejido se observa en el microscopio para ver si el tejido es normal o anormal. El endometrio es el revestimiento interno del tero. Este procedimiento ayuda a determinar si est en el ciclo menstrual y de que modo los niveles de hormonas afectan el revestimiento del tero. Este procedimiento tambin se [place] para evaluar el sangrado uterino o para Electrical engineer de endometrio, tuberculosis, plipos o enfermedades inflamatorias.  INFORME A SU  MDICO:  Cualquier alergia que tenga.  Todos los [employer] [place], incluyendo vitaminas, hierbas, gotas oftlmicas, cremas y medicamentos de venta libre.  Problemas previos que usted o los [employer] de su familia hayan tenido con el uso de anestsicos.  Enfermedades de [employer].  Cirugas previas.  Padecimientos mdicos.  Posible embarazo. RIESGOS Y COMPLICACIONES Generalmente es un procedimiento seguro. Sin embargo, Games developer procedimiento, pueden surgir complicaciones. Las complicaciones posibles son:  Hemorragias.  Infecciones plvicas  Lesin en la pared del tero con el instrumento utilizado para tomar la biopsia (raro). ANTES DEL PROCEDIMIENTO   Lleve un registro de sus ciclos menstruales segn las indicaciones de su mdico. Puede ser necesario que programe el procedimiento para un momento especfico del ciclo menstrual.  Tendr que llevar un apsito sanitario para usar despus del procedimiento.  Pdale a alguna persona que la lleve a su casa despus del procedimiento si le dan un medicamento para relajarse (sedante). PROCEDIMIENTO   Le podrn administrar un medicamento para relajarse.  Deber recostarse en una camilla con los pies y las piernas elevados, como en el examen plvico.  El mdico insertar un instrumento (espculo) en la vagina para observar el cuello del tero.  El cuello del tero ser desinfectado con una solucin antisptica. Para adormecer el cuello del tero le aplicarn un medicamento (anestsico local ).  Se utilizar un frceps (tenculo) para Contractor International Paper.  Se insertar un instrumento delgado, similar a una varilla (sonda uterina) a travs del cuello del tero para Teacher, adult education su longitud y Market researcher en la que ser tomada la [place]  para la biopsia.  Luego se pasa un tubo delgado y flexible (catter) a travs del cuello del tero hasta el tero. El catter se [place] para Barista de tejido del  endometrio para la biopsia.  El catter y el espculo se retirarn y la [place] se enviar al laboratorio para ser examinada. DESPUS DEL PROCEDIMIENTO  Descansar en una sala de recuperacin hasta que est lista para volver a su casa.  Sentir clicos leves y tendr una pequea cantidad de sangrado vaginal durante algunos das despus del procedimiento. Esto es normal.  Asegrese de [employer]. Document Released: 07/22/2013 [facility] Patient Information 2014 [place].

## 2014-03-18 NOTE — Addendum Note (Signed)
Addended by: Alen Blew on: 03/18/2014 08:55 AM   Modules accepted: Orders

## 2014-03-19 LAB — URINE CULTURE
Colony Count: NO GROWTH
Organism ID, Bacteria: NO GROWTH

## 2014-03-29 ENCOUNTER — Other Ambulatory Visit: Payer: Self-pay | Admitting: Gynecology

## 2014-03-29 ENCOUNTER — Ambulatory Visit (INDEPENDENT_AMBULATORY_CARE_PROVIDER_SITE_OTHER): Payer: BC Managed Care – PPO | Admitting: Gynecology

## 2014-03-29 ENCOUNTER — Encounter: Payer: Self-pay | Admitting: Gynecology

## 2014-03-29 ENCOUNTER — Ambulatory Visit (INDEPENDENT_AMBULATORY_CARE_PROVIDER_SITE_OTHER): Payer: BC Managed Care – PPO

## 2014-03-29 VITALS — BP 126/78

## 2014-03-29 DIAGNOSIS — N854 Malposition of uterus: Secondary | ICD-10-CM

## 2014-03-29 DIAGNOSIS — O34539 Maternal care for retroversion of gravid uterus, unspecified trimester: Secondary | ICD-10-CM

## 2014-03-29 DIAGNOSIS — N83209 Unspecified ovarian cyst, unspecified side: Secondary | ICD-10-CM

## 2014-03-29 DIAGNOSIS — N921 Excessive and frequent menstruation with irregular cycle: Secondary | ICD-10-CM

## 2014-03-29 DIAGNOSIS — Z975 Presence of (intrauterine) contraceptive device: Principal | ICD-10-CM

## 2014-03-29 DIAGNOSIS — D649 Anemia, unspecified: Secondary | ICD-10-CM

## 2014-03-29 DIAGNOSIS — O34519 Maternal care for incarceration of gravid uterus, unspecified trimester: Secondary | ICD-10-CM

## 2014-03-29 DIAGNOSIS — N83202 Unspecified ovarian cyst, left side: Secondary | ICD-10-CM | POA: Insufficient documentation

## 2014-03-29 LAB — FERRITIN: Ferritin: 23 ng/mL (ref 10–291)

## 2014-03-29 LAB — IRON AND TIBC
%SAT: 7 % — ABNORMAL LOW (ref 20–55)
Iron: 28 ug/dL — ABNORMAL LOW (ref 42–145)
TIBC: 414 ug/dL (ref 250–470)
UIBC: 386 ug/dL (ref 125–400)

## 2014-03-29 LAB — FOLATE: FOLATE: 11 ng/mL

## 2014-03-29 LAB — VITAMIN B12: VITAMIN B 12: 564 pg/mL (ref 211–911)

## 2014-03-29 MED ORDER — MEDROXYPROGESTERONE ACETATE 150 MG/ML IM SUSP
150.0000 mg | Freq: Once | INTRAMUSCULAR | Status: AC
  Administered 2014-03-29: 150 mg via INTRAMUSCULAR

## 2014-03-29 NOTE — Patient Instructions (Signed)
Quiste ovrico (Ovarian Cyst) Un quiste ovrico es una bolsa llena de lquido que se forma en el ovario. Los ovarios son los rganos pequeos que producen vulos en las mujeres. Se pueden formar varios tipos de [employer]. Benito Mccreedy no son cancerosos. Muchos de ellos no causan problemas y con frecuencia desaparecen solos. Algunos pueden provocar sntomas y requerir Clinical research associate. Los tipos ms comunes de quistes ovricos son los siguientes:  Quistes funcionales: estos quistes pueden aparecer todos los meses durante el ciclo menstrual. Esto es normal. Estos quistes suelen desaparecer con el prximo ciclo menstrual si la mujer no queda embarazada. En general, los quistes funcionales no tienen sntomas.  Endometriomas: estos quistes se forman a partir del tejido que recubre el tero. Tambin se denominan "quistes de chocolate" porque se llenan de sangre que se vuelve marrn. Este tipo de quiste puede Engineer, production en la zona inferior del abdomen durante la relacin sexual y con el perodo menstrual.  Cistoadenomas: este tipo se desarrolla a partir de las clulas que se [place] en el exterior del ovario. Estos quistes pueden ser muy grandes y causar dolor en la zona inferior del abdomen y durante la relacin sexual. Cindra Presume tipo de quiste puede girar sobre s mismo, cortar el suministro de Biochemist, clinical y causar un dolor intenso. Tambin se puede romper con facilidad y Stage manager.  Quistes dermoides: este tipo de quiste a veces se encuentra en ambos ovarios. Estos quistes pueden [employer] tipos de tejidos del organismo, como piel, dientes, pelo o Database administrator. Generalmente no tienen sntomas, a menos que sean muy grandes.  Quistes tecalutenicos: aparecen cuando se produce demasiada cantidad de cierta hormona (gonadotropina corinica humana) que estimula en exceso al ovario para que produzca vulos. Esto es ms frecuente despus de procedimientos que ayudan a la concepcin de un beb  (fertilizacin in vitro). CAUSAS   Los medicamentos para la fertilidad pueden provocar una afeccin mediante la cual se forman mltiples quistes de gran tamao en los ovarios. Esta se denomina sndrome de hiperestimulacin ovrica.  El sndrome del ovario poliqustico es una afeccin que puede causar desequilibrios hormonales, los cuales pueden dar como resultado quistes ovricos no funcionales. SIGNOS Y SNTOMAS  Muchos quistes ovricos no causan sntomas. Si se presentan sntomas, stos pueden ser:  Dolor o molestias en la pelvis.  Dolor en la parte baja del abdomen.  [place].  Aumento del permetro abdominal (hinchazn).  Perodos menstruales anormales.  Aumento del [employer] perodos [place].  Cese de los perodos menstruales sin estar embarazada. DIAGNSTICO  Estos quistes se descubren comnmente durante un examen de rutina o una exploracin ginecolgica anual. Es posible que se ordenen otros estudios para obtener ms informacin sobre el [place]. Estos estudios pueden ser:  Engineer, materials.  Radiografas de la pelvis.  Tomografa computada.  Resonancia magntica.  Anlisis de [place]. TRATAMIENTO  Muchos de los quistes ovricos desaparecen por s solos, sin tratamiento. Es probable que el mdico quiera controlar el quiste regularmente durante 2 o 63mses para ver si se produce algn cambio. En el caso de las mujeres en la menopausia, es particularmente importante controlar de cerca al quiste ya que el ndice de cncer de ovario en las mujeres menopusicas es ms alto. Cuando se requiere tClinical research associate este puede incluir cualquiera de los siguientes:  Un procedimiento para drenar el quiste (aspiracin). Esto se puede realizar mFamily Dollar Storesuso de [place] y [place] se puede hacer a travs de un procedimiento laparoscpico, En  drenar el quiste (aspiración). Esto se puede realizar mediante el uso de una aguja grande y una ecografía. También se puede hacer a través de un procedimiento laparoscópico, En este procedimiento, se inserta un tubo delgado que emite luz y que tiene una pequeña cámara en un  extremo (laparoscopio) a través de una pequeña incisión.  · Cirugía para extirpar el quiste completo. Esto se puede realizar mediante una cirugía laparoscópica o una cirugía abierta, la cual implica realizar una incisión más grande en la parte inferior del abdomen.  · Tratamiento hormonal o píldoras anticonceptivas. Estos métodos a veces se usan para ayudar a disolver un quiste.  INSTRUCCIONES PARA EL CUIDADO EN EL HOGAR   · Tome solo medicamentos de venta libre o recetados, según las indicaciones del médico.  · Concurra a las consultas de control con su médico según las indicaciones.  · Hágase exámenes pélvicos regulares y pruebas de [place]apan[place].  SOLICITE ATENCIÓN MÉDICA SI:   · Los períodos se atrasan, son irregulares, dolorosos o cesan.  · El dolor pélvico o abdominal no desaparece.  · El abdomen se agranda o se hincha.  · Siente presión en la vejiga o no puede vaciarla completamente.  · Siente dolor durante las relaciones sexuales.  · Tiene una sensación de hinchazón, presión o molestias en el estómago.  · Pierde peso sin razón aparente.  · Siente un malestar generalizado.  · Está estreñida.  · Pierde el apetito.  · Le aparece acné.  · Nota un aumento del vello corporal y facial.  · Aumenta de peso sin hacer modificaciones en su actividad física y en su dieta habitual.  · Sospecha que está embarazada.  SOLICITE ATENCIÓN MÉDICA DE INMEDIATO SI:   · Siente cada vez más dolor abdominal.  · Tiene malestar estomacal (náuseas) y vomita.  · Tiene fiebre que se presenta de manera repentina.  · Siente dolor abdominal al defecar.  · Sus períodos menstruales son más abundantes que lo habitual.  Document Released: 08/29/2005 Document Revised: 09/09/2013  ExitCare® Patient Information ©2014 [employer], LLC.

## 2014-03-29 NOTE — Progress Notes (Signed)
   Patient presented to the office today to discuss her ultrasound as well as her workup for her anemia. Patient was seen in the office early this month for her anal exam and for 4 months have been complaining of intermenstrual bleeding. Patient had a Mirena IUD in 2014 and had been doing well otherwise.  The patient had an endometrial biopsy on that office visit in April 2015 which demonstrated the following:  Diagnosis Endometrium, biopsy, uterus - BENIGN SECRETORY ENDOMETRIUM. NO HYPERPLASIA OR MALIGNANCY IDENTIFIED.  Patient had patient had a normal comprehensive metabolic panel, lipid profile, urinalysis, and TSH. Her hemoglobin was found to be 10.2 with a platelet count of 490,000.  Ultrasound today: Uterus measures 7.8 x 6.6 x 4.9 cm with endometrial stripe of 2.3 mm. IUD was seen in the normal position. Right ovary was normal. Left ovary with a thin wall echo free cyst measuring 46 x 35 x 26 mm average size 36 mm. Avascular. No fluid in the cul-de-sac.  Assessment/plan: Patient with dysfunctional uterine bleeding with Mirena IUD and left ovarian cyst. Patient will receive a shot of Depo-Provera 150 mg IM and return to the office in 3 months for followup ultrasound .She will continue on her iron supplementation as was instructed. A full anemia panel was drawn today.

## 2014-03-30 ENCOUNTER — Other Ambulatory Visit: Payer: Self-pay | Admitting: Gynecology

## 2014-03-30 DIAGNOSIS — D649 Anemia, unspecified: Secondary | ICD-10-CM

## 2014-04-08 ENCOUNTER — Telehealth: Payer: Self-pay | Admitting: *Deleted

## 2014-04-08 NOTE — Telephone Encounter (Signed)
Message copied by Thamas Jaegers on Thu Apr 08, 2014 11:54 AM ------      Message from: Sinclair Grooms      Created: Thu Apr 08, 2014 11:34 AM      Regarding: nurse call       Patient had a depo shot April 27 to help with the cyst that she has. Monday she started bleeding heavy and with some pain, she wants to know what can she do. She has repeat ultrasound schedule for July. ------

## 2014-04-08 NOTE — Telephone Encounter (Signed)
Spanish speaking patient Madison Stewart spoke to see below

## 2014-04-09 MED ORDER — IBUPROFEN 800 MG PO TABS: 800.0000 mg | ORAL_TABLET | Freq: Three times a day (TID) | ORAL | Status: AC | PRN

## 2014-04-09 NOTE — Telephone Encounter (Signed)
Motrin 800 mg q 8hrs. The Depo should start kicking in.

## 2014-04-09 NOTE — Telephone Encounter (Signed)
rx sent, claudia will inform patient

## 2014-06-30 ENCOUNTER — Other Ambulatory Visit: Payer: BC Managed Care – PPO

## 2014-06-30 ENCOUNTER — Ambulatory Visit: Payer: BC Managed Care – PPO | Admitting: Gynecology

## 2014-07-21 ENCOUNTER — Ambulatory Visit: Payer: BC Managed Care – PPO | Admitting: Gynecology

## 2014-07-21 ENCOUNTER — Other Ambulatory Visit: Payer: BC Managed Care – PPO

## 2014-09-01 ENCOUNTER — Other Ambulatory Visit: Payer: Self-pay | Admitting: Gynecology

## 2014-09-01 ENCOUNTER — Ambulatory Visit (INDEPENDENT_AMBULATORY_CARE_PROVIDER_SITE_OTHER): Payer: BC Managed Care – PPO

## 2014-09-01 ENCOUNTER — Ambulatory Visit (INDEPENDENT_AMBULATORY_CARE_PROVIDER_SITE_OTHER): Payer: BC Managed Care – PPO | Admitting: Gynecology

## 2014-09-01 DIAGNOSIS — N83209 Unspecified ovarian cyst, unspecified side: Secondary | ICD-10-CM

## 2014-09-01 DIAGNOSIS — N8300 Follicular cyst of ovary, unspecified side: Secondary | ICD-10-CM

## 2014-09-01 DIAGNOSIS — IMO0002 Reserved for concepts with insufficient information to code with codable children: Secondary | ICD-10-CM

## 2014-09-01 DIAGNOSIS — N83202 Unspecified ovarian cyst, left side: Secondary | ICD-10-CM

## 2014-09-01 DIAGNOSIS — D259 Leiomyoma of uterus, unspecified: Secondary | ICD-10-CM

## 2014-09-01 DIAGNOSIS — D649 Anemia, unspecified: Secondary | ICD-10-CM

## 2014-09-01 LAB — CBC WITH DIFFERENTIAL/PLATELET
Basophils Absolute: 0.1 10*3/uL (ref 0.0–0.1)
Basophils Relative: 1 % (ref 0–1)
EOS PCT: 2 % (ref 0–5)
Eosinophils Absolute: 0.2 10*3/uL (ref 0.0–0.7)
HCT: 40.1 % (ref 36.0–46.0)
HEMOGLOBIN: 13.6 g/dL (ref 12.0–15.0)
LYMPHS ABS: 2.7 10*3/uL (ref 0.7–4.0)
LYMPHS PCT: 25 % (ref 12–46)
MCH: 28.4 pg (ref 26.0–34.0)
MCHC: 33.9 g/dL (ref 30.0–36.0)
MCV: 83.7 fL (ref 78.0–100.0)
MONOS PCT: 7 % (ref 3–12)
Monocytes Absolute: 0.8 10*3/uL (ref 0.1–1.0)
NEUTROS PCT: 65 % (ref 43–77)
Neutro Abs: 7.1 10*3/uL (ref 1.7–7.7)
PLATELETS: 414 10*3/uL — AB (ref 150–400)
RBC: 4.79 MIL/uL (ref 3.87–5.11)
RDW: 16.1 % — ABNORMAL HIGH (ref 11.5–15.5)
WBC: 10.9 10*3/uL — AB (ref 4.0–10.5)

## 2014-09-01 NOTE — Patient Instructions (Signed)
Marcador tumoral CA-125 (CA-125 Tumor Marker) El CA-125 es un marcador tumoral que sirve para Aeronautical engineer evolucin del cncer de ovario o de endometrio. PREPARACIN PARA EL ESTUDIO No se requiere Scientist, research (life sciences). HALLAZGOS NORMALES Adultos: 0a35unidades/ml (0a78miliunidades/l) Los rangos para los resultados normales pueden variar entre diferentes laboratorios y hospitales. Consulte siempre con su mdico despus de Psychologist, counselling estudio para Armed forces logistics/support/administrative officer significado de los [place] y si los valores se consideran "dentro de los lmites normales". SIGNIFICADO DEL ESTUDIO  El mdico leer los resultados y Electrical engineer con usted sobre la importancia y el significado de los [place], as como las opciones de tratamiento y la necesidad de Optometrist pruebas adicionales, si fuera necesario. OBTENCIN DE LOS RESULTADOS DE LAS PRUEBAS Es su responsabilidad retirar el resultado del [place]. Consulte en el laboratorio cundo y cmo podr [employer]. Document Released: 09/09/2013 [facility] Patient Information 2015 [place]. This information is not intended to replace advice given to you by your health care provider. Make sure you discuss any questions you have with your health care provider.

## 2014-09-01 NOTE — Progress Notes (Signed)
   Patient presented to the office today to discuss her ultrasound followup of left ovarian cyst seen on April 2015. Patient had complained of having intermenstrual bleeding and this was initiated the workup. She had a Mirena IUD placed in 2014. In April of this year she had an endometrial biopsy which was benign. Her hemoglobin was found to be 10.2 with a platelet count of 490,000. Her TSH and a comprehensive metabolic panel were normal. Her anemia workup and demonstrated that her iron level was low at 28 and a percent saturation was 7% which was low. She has been taking iron supplementation since then. Patient received a Depo-Provera shot 150 mg on April of 2015 in an effort to suppress the cyst.  Ultrasound today: Uterus measured 8.1 x 7.1 x 4.7 cm with endometrial stripe of 2.9 mm. IUD was seen in the normal position in the uterine cavity. A small right ovarian follicle measuring 25 x 15 mm was noted. Left ovarian cyst echo-free negative color flow measuring 3.5 a 4.0 and her size 3.7 cm.  The patient is asymptomatic otherwise.  Assessment/plan: Decreasing size simple left ovarian cyst we'll continue to monitor her and follow up with ultrasound in 6 months. We will do a CA 125 today. Patient fully aware its limitations. She will continue on her iron supplementation we will check her CBC today as well. All the above instructions were discussed in Spanish.

## 2014-09-02 LAB — CA 125: CA 125: 13 U/mL (ref <35)

## 2014-10-04 ENCOUNTER — Encounter: Payer: Self-pay | Admitting: Gynecology

## 2017-04-17 ENCOUNTER — Encounter: Payer: Self-pay | Admitting: Gynecology

## 2019-06-11 ENCOUNTER — Other Ambulatory Visit (HOSPITAL_COMMUNITY): Payer: Self-pay | Admitting: *Deleted

## 2019-06-11 DIAGNOSIS — N644 Mastodynia: Secondary | ICD-10-CM

## 2019-07-14 ENCOUNTER — Ambulatory Visit (HOSPITAL_COMMUNITY): Payer: Self-pay

## 2019-07-14 ENCOUNTER — Other Ambulatory Visit: Payer: Self-pay

## 2019-08-18 ENCOUNTER — Ambulatory Visit (HOSPITAL_COMMUNITY)
Admission: RE | Admit: 2019-08-18 | Discharge: 2019-08-18 | Disposition: A | Discharge status: Home / Self Care | Payer: Self-pay | Source: Ambulatory Visit | Attending: Obstetrics and Gynecology | Admitting: Obstetrics and Gynecology

## 2019-08-18 ENCOUNTER — Ambulatory Visit
Admission: RE | Admit: 2019-08-18 | Discharge: 2019-08-18 | Disposition: A | Discharge status: Home / Self Care | Payer: No Typology Code available for payment source | Source: Ambulatory Visit | Attending: Obstetrics and Gynecology | Admitting: Obstetrics and Gynecology

## 2019-08-18 ENCOUNTER — Encounter (HOSPITAL_COMMUNITY): Payer: Self-pay

## 2019-08-18 ENCOUNTER — Ambulatory Visit: Payer: Self-pay

## 2019-08-18 ENCOUNTER — Other Ambulatory Visit: Payer: Self-pay

## 2019-08-18 DIAGNOSIS — Z1239 Encounter for other screening for malignant neoplasm of breast: Secondary | ICD-10-CM

## 2019-08-18 DIAGNOSIS — N644 Mastodynia: Secondary | ICD-10-CM

## 2019-08-18 HISTORY — DX: Anemia, unspecified: D64.9

## 2019-08-18 NOTE — Progress Notes (Signed)
Complaints of bilateral lower breast pain x 4 months that comes and goes. Patient rates the pain at a 8 out of 10.  Pap Smear: Pap smear not completed today. Last Pap smear was in July 2020 at [facility] and normal per patient. Per patient has no history of an abnormal Pap smear. Last Pap smear result is not in Epic. Previous Pap smear result from 03/08/2014 was in [place].  Physical exam: Breasts Breasts symmetrical. No skin abnormalities bilateral breasts. No nipple retraction bilateral breasts. No nipple discharge bilateral breasts. No lymphadenopathy. No lumps palpated bilateral breasts. Complaints of bilateral lower breast tenderness on exam. Referred patient to the [facility] for a diagnostic mammogram. Appointment scheduled for Tuesday, August 18, 2019 at 1520.        Pelvic/Bimanual No Pap smear completed today since last Pap smear was in July 2020 per patient. Pap smear not indicated per BCCCP guidelines.   Smoking History: Patient has never smoked.  Patient Navigation: Patient education provided. Access to services provided for patient through [facility] program. Spanish interpreter provided.   Breast and Cervical Cancer Risk Assessment: Patient has no family history of breast cancer, known genetic mutations, or radiation treatment to the chest before age 89. Patient has no history of cervical dysplasia, immunocompromised, or DES exposure in-utero.  Risk Assessment    Risk Scores      08/18/2019   Last edited by: Armond Hang, LPN   5-year risk: 0.4 %   Lifetime risk: 6.2 %         Used Spanish interpreter Rudene Anda from [place].

## 2019-08-18 NOTE — Patient Instructions (Signed)
Explained breast self awareness with Chrisandra Netters. Patient did not need a Pap smear today due to last Pap smear was in July 2020 per patient. Let her know BCCCP will cover Pap smears every 3 years unless has a history of abnormal Pap smears. Referred patient to the [facility] for a diagnostic mammogram. Appointment scheduled for Tuesday, August 18, 2019 at 1520. Patient aware of appointment and will be there. Chrisandra Netters verbalized understanding.  [place]ann[place], Arvil Chaco, RN 2:02 PM

## 2019-08-19 ENCOUNTER — Encounter (HOSPITAL_COMMUNITY): Payer: Self-pay | Admitting: *Deleted

## 2019-09-09 ENCOUNTER — Ambulatory Visit: Payer: Self-pay | Admitting: Obstetrics & Gynecology

## 2024-09-21 ENCOUNTER — Emergency Department (HOSPITAL_BASED_OUTPATIENT_CLINIC_OR_DEPARTMENT_OTHER): Payer: Self-pay

## 2024-09-21 ENCOUNTER — Emergency Department (HOSPITAL_BASED_OUTPATIENT_CLINIC_OR_DEPARTMENT_OTHER)
Admission: EM | Admit: 2024-09-21 | Discharge: 2024-09-22 | Disposition: A | Discharge status: Home / Self Care | Payer: Self-pay | Attending: Emergency Medicine | Admitting: Emergency Medicine

## 2024-09-21 DIAGNOSIS — B029 Zoster without complications: Secondary | ICD-10-CM | POA: Insufficient documentation

## 2024-09-21 DIAGNOSIS — N644 Mastodynia: Secondary | ICD-10-CM | POA: Insufficient documentation

## 2024-09-21 DIAGNOSIS — M549 Dorsalgia, unspecified: Secondary | ICD-10-CM | POA: Insufficient documentation

## 2024-09-22 ENCOUNTER — Encounter (HOSPITAL_COMMUNITY): Payer: Self-pay

## 2024-09-22 LAB — PREGNANCY, URINE: Preg Test, Ur: NEGATIVE

## 2024-09-22 MED ORDER — HYDROMORPHONE HCL 1 MG/ML IJ SOLN
1.0000 mg | Freq: Once | INTRAMUSCULAR | Status: AC
  Administered 2024-09-22: 1 mg via SUBCUTANEOUS
  Filled 2024-09-22: qty 1

## 2024-09-22 MED ORDER — VALACYCLOVIR HCL 500 MG PO TABS
1000.0000 mg | ORAL_TABLET | Freq: Once | ORAL | Status: AC
  Administered 2024-09-22: 1000 mg via ORAL
  Filled 2024-09-22: qty 2

## 2024-09-22 MED ORDER — OXYCODONE-ACETAMINOPHEN 5-325 MG PO TABS: 1.0000 | ORAL_TABLET | Freq: Four times a day (QID) | ORAL | 0 refills | Status: AC | PRN

## 2024-09-22 MED ORDER — VALACYCLOVIR HCL 1 G PO TABS: 1000.0000 mg | ORAL_TABLET | Freq: Three times a day (TID) | ORAL | 0 refills | Status: AC

## 2024-09-22 NOTE — ED Notes (Signed)
 Pt apologized for moment of non cooperation.  Thanked staff and left with her DC paper work after being medicated.

## 2024-09-22 NOTE — ED Notes (Signed)
 ED Provider at bedside.

## 2024-09-22 NOTE — ED Provider Notes (Signed)
 [place] [facility] Provider Note   CSN: [id number] Arrival date & time: 09/21/24  2158     Patient presents with: Chest Pain, Back Pain, and Rash   Madison Stewart is a 48 y.o. female.   The history is provided by the patient. A language interpreter was used.  Chest Pain Associated symptoms: back pain   Back Pain Associated symptoms: chest pain   Rash Madison Stewart is a 48 y.o. female who presents to the Emergency Department complaining of back pain. She presents the emergency department for evaluation of one week of left sided back pain that radiates to her chest. Yesterday she developed a rash in this area. She does have mild associated shortness of breath, bodyaches. She has no known medical problems and does not take routine medications. No prior similar symptoms.   Prior to Admission medications   Medication Sig Start Date End Date Taking? Authorizing Provider  oxyCODONE -acetaminophen  (PERCOCET/ROXICET) 5-325 MG tablet Take 1 tablet by mouth every 6 (six) hours as needed for severe pain (pain score 7-10). 09/22/24  Yes Griselda Norris, MD  valACYclovir (VALTREX) 1000 MG tablet Take 1 tablet (1,000 mg total) by mouth 3 (three) times daily. 09/22/24  Yes Griselda Norris, MD    Allergies: Patient has no known allergies.    Review of Systems  Cardiovascular:  Positive for chest pain.  Musculoskeletal:  Positive for back pain.  Skin:  Positive for rash.  All other systems reviewed and are negative.   Updated Vital Signs BP 137/73   Pulse 68   Temp 98.4 F (36.9 C) (Oral)   Resp 18   Ht 5' 2 (1.575 m)   Wt 80.7 kg   SpO2 99%   BMI 32.56 kg/m   Physical Exam Vitals and nursing note reviewed.  Constitutional:      Appearance: She is well-developed.  HENT:     Head: Normocephalic and atraumatic.  Cardiovascular:     Rate and Rhythm: Normal rate and regular rhythm.     Heart sounds: No murmur heard. Pulmonary:      Effort: Pulmonary effort is normal. No respiratory distress.     Breath sounds: Normal breath sounds.  Abdominal:     Palpations: Abdomen is soft.     Tenderness: There is no abdominal tenderness. There is no guarding or rebound.  Musculoskeletal:        General: No tenderness.  Skin:    General: Skin is warm and dry.     Comments: Vesicular rash in the left upper thoracic back and across the left upper breast  Neurological:     Mental Status: She is alert and oriented to person, place, and time.     Comments: Normal gait with five out of five strength in all four extremities  Psychiatric:        Behavior: Behavior normal.     (all labs ordered are listed, but only abnormal results are displayed) Labs Reviewed  BASIC METABOLIC PANEL WITH GFR - Abnormal; Notable for the following components:      Result Value   Glucose, Bld 108 (*)    Calcium 8.6 (*)    All other components within normal limits  CBC  PREGNANCY, URINE  TROPONIN T, HIGH SENSITIVITY  TROPONIN T, HIGH SENSITIVITY    EKG: EKG Interpretation Date/Time:  Monday September 21 2024 22:10:32 EDT Ventricular Rate:  73 PR Interval:  127 QRS Duration:  89 QT Interval:  371 QTC Calculation:  409 R Axis:   78  Text Interpretation: Sinus rhythm Confirmed by Griselda Norris [id number]) on 09/22/2024 12:24:16 AM  Radiology: DG Chest 2 View Result Date: 09/21/2024 CLINICAL DATA:  Chest pain for 1 week EXAM: CHEST - 2 VIEW COMPARISON:  None Available. FINDINGS: The heart size and mediastinal contours are within normal limits. Both lungs are clear. The visualized skeletal structures are unremarkable. Postsurgical changes in the cervical spine are noted. IMPRESSION: No active cardiopulmonary disease. Electronically Signed   By: Oneil Devonshire M.D.   On: 09/21/2024 22:30     Procedures   Medications Ordered in the ED  valACYclovir (VALTREX) tablet 1,000 mg (1,000 mg Oral Given 09/22/24 0159)  HYDROmorphone  (DILAUDID ) injection 1  mg (1 mg Subcutaneous Given 09/22/24 0159)                                    Medical Decision Making Amount and/or Complexity of Data Reviewed Labs: ordered. Radiology: ordered.  Risk Prescription drug management.   Patient here for evaluation of left sided back and breast pain. On examination she does have zoster, no evidence of dissemination. EKG is not ischemic and troponin is negative. Current picture is not consistent with ACS, PE, dissection, pneumonia. Discussed with patient findings of studies. Discussed home care for zoster as well as infection prevention measures. Discussed outpatient follow-up as well as return precautions.     Final diagnoses:  Herpes zoster without complication    ED Discharge Orders          Ordered    valACYclovir (VALTREX) 1000 MG tablet  3 times daily        09/22/24 0154    oxyCODONE -acetaminophen  (PERCOCET/ROXICET) 5-325 MG tablet  Every 6 hours PRN        09/22/24 0154               Griselda Norris, MD 09/22/24 [phone]

## 2024-09-22 NOTE — ED Notes (Signed)
 Edp arrived to room to talk to pt. The pt noted to edp that now at this point she would like to leave.  I spoke with the pt and she agreed to talk with the edp in hopes of controlling her current pain.  Edp is still in room at this time speaking with pt at length.
